# Patient Record
Sex: Female | Born: 1985 | Race: Black or African American | Hispanic: No | Marital: Single | Smoking: Current every day smoker
Health system: Southern US, Community
[De-identification: ages and names within clinical notes are randomized; demographics above are authoritative.]

## PROBLEM LIST (undated history)

## (undated) DIAGNOSIS — F1020 Alcohol dependence, uncomplicated: Secondary | ICD-10-CM

## (undated) DIAGNOSIS — R188 Other ascites: Secondary | ICD-10-CM

## (undated) DIAGNOSIS — K7460 Unspecified cirrhosis of liver: Secondary | ICD-10-CM

## (undated) DIAGNOSIS — K219 Gastro-esophageal reflux disease without esophagitis: Secondary | ICD-10-CM

---

## 2013-05-09 ENCOUNTER — Emergency Department: Payer: Self-pay | Admitting: Emergency Medicine

## 2013-05-10 LAB — BASIC METABOLIC PANEL
Anion Gap: 3 — ABNORMAL LOW (ref 7–16)
BUN: 4 mg/dL — ABNORMAL LOW (ref 7–18)
CREATININE: 0.67 mg/dL (ref 0.60–1.30)
Calcium, Total: 8.5 mg/dL (ref 8.5–10.1)
Chloride: 111 mmol/L — ABNORMAL HIGH (ref 98–107)
Co2: 29 mmol/L (ref 21–32)
EGFR (Non-African Amer.): >60
GLUCOSE: 85 mg/dL (ref 65–99)
Osmolality: 281 (ref 275–301)
Potassium: 3.5 mmol/L (ref 3.5–5.1)
Sodium: 143 mmol/L (ref 136–145)

## 2013-05-10 LAB — PRO B NATRIURETIC PEPTIDE: B-Type Natriuretic Peptide: 25 pg/mL (ref 0–125)

## 2013-05-10 LAB — CBC
HCT: 38.3 % (ref 35.0–47.0)
HGB: 13.3 g/dL (ref 12.0–16.0)
MCH: 34.1 pg — ABNORMAL HIGH (ref 26.0–34.0)
MCHC: 34.7 g/dL (ref 32.0–36.0)
MCV: 98 fL (ref 80–100)
Platelet: 148 10*3/uL — ABNORMAL LOW (ref 150–440)
RBC: 3.9 10*6/uL (ref 3.80–5.20)
RDW: 13.8 % (ref 11.5–14.5)
WBC: 4.7 10*3/uL (ref 3.6–11.0)

## 2014-05-14 ENCOUNTER — Emergency Department: Admit: 2014-05-14 | Disposition: A | Discharge status: Home / Self Care | Payer: Self-pay | Admitting: Student

## 2014-05-14 LAB — CBC WITH DIFFERENTIAL/PLATELET
BASOS ABS: 0.1 10*3/uL (ref 0.0–0.1)
Basophil %: 1 %
Eosinophil #: 0.1 10*3/uL (ref 0.0–0.7)
Eosinophil %: 0.7 %
HCT: 36.5 % (ref 35.0–47.0)
HGB: 12.5 g/dL (ref 12.0–16.0)
Lymphocyte #: 2.5 10*3/uL (ref 1.0–3.6)
Lymphocyte %: 21.3 %
MCH: 35.2 pg — AB (ref 26.0–34.0)
MCHC: 34.2 g/dL (ref 32.0–36.0)
MCV: 103 fL — AB (ref 80–100)
MONO ABS: 1.1 x10 3/mm — AB (ref 0.2–0.9)
Monocyte %: 9.2 %
NEUTROS ABS: 7.9 10*3/uL — AB (ref 1.4–6.5)
Neutrophil %: 67.8 %
PLATELETS: 219 10*3/uL (ref 150–440)
RBC: 3.55 10*6/uL — ABNORMAL LOW (ref 3.80–5.20)
RDW: 14.4 % (ref 11.5–14.5)
WBC: 11.7 10*3/uL — ABNORMAL HIGH (ref 3.6–11.0)

## 2014-05-14 LAB — URINALYSIS, COMPLETE
BLOOD: NEGATIVE
Bilirubin,UR: NEGATIVE
GLUCOSE, UR: NEGATIVE mg/dL (ref 0–75)
KETONE: NEGATIVE
Leukocyte Esterase: NEGATIVE
NITRITE: NEGATIVE
PROTEIN: NEGATIVE
Ph: 7 (ref 4.5–8.0)
SPECIFIC GRAVITY: 1.005 (ref 1.003–1.030)

## 2014-05-14 LAB — COMPREHENSIVE METABOLIC PANEL
ANION GAP: 10 (ref 7–16)
AST: 374 U/L — AB
Albumin: 3.2 g/dL — ABNORMAL LOW
Alkaline Phosphatase: 218 U/L — ABNORMAL HIGH
BUN: <5 mg/dL
Bilirubin,Total: 2.3 mg/dL — ABNORMAL HIGH
CO2: 28 mmol/L
CREATININE: 0.53 mg/dL
Calcium, Total: 9 mg/dL
Chloride: 99 mmol/L — ABNORMAL LOW
EGFR (African American): >60
Glucose: 131 mg/dL — ABNORMAL HIGH
POTASSIUM: 2.7 mmol/L — AB
SGPT (ALT): 75 U/L — ABNORMAL HIGH
Sodium: 137 mmol/L
Total Protein: 7.1 g/dL

## 2014-05-14 LAB — LIPASE, BLOOD: LIPASE: 30 U/L

## 2014-05-14 LAB — PROTIME-INR
INR: 1.2
Prothrombin Time: 14.9 secs

## 2014-05-14 LAB — APTT: ACTIVATED PTT: 35 s (ref 23.6–35.9)

## 2014-06-06 ENCOUNTER — Other Ambulatory Visit: Payer: Self-pay | Admitting: Nurse Practitioner

## 2014-06-06 DIAGNOSIS — R945 Abnormal results of liver function studies: Secondary | ICD-10-CM

## 2014-06-06 DIAGNOSIS — R7989 Other specified abnormal findings of blood chemistry: Secondary | ICD-10-CM

## 2014-06-06 DIAGNOSIS — R188 Other ascites: Secondary | ICD-10-CM

## 2014-06-07 ENCOUNTER — Other Ambulatory Visit: Payer: Self-pay | Admitting: Radiology

## 2014-06-08 ENCOUNTER — Ambulatory Visit
Admission: RE | Admit: 2014-06-08 | Discharge: 2014-06-08 | Disposition: A | Discharge status: Home / Self Care | Payer: Medicaid Other | Source: Ambulatory Visit | Attending: Nurse Practitioner | Admitting: Nurse Practitioner

## 2014-06-08 ENCOUNTER — Other Ambulatory Visit: Payer: Self-pay | Admitting: Nurse Practitioner

## 2014-06-08 DIAGNOSIS — R188 Other ascites: Secondary | ICD-10-CM | POA: Diagnosis present

## 2014-06-08 DIAGNOSIS — R945 Abnormal results of liver function studies: Secondary | ICD-10-CM | POA: Diagnosis present

## 2014-06-08 DIAGNOSIS — R7989 Other specified abnormal findings of blood chemistry: Secondary | ICD-10-CM

## 2014-06-28 ENCOUNTER — Ambulatory Visit: Payer: Medicaid Other | Admitting: Certified Registered Nurse Anesthetist

## 2014-06-28 ENCOUNTER — Encounter
Admission: RE | Disposition: A | Discharge status: Home / Self Care | Payer: Self-pay | Source: Ambulatory Visit | Attending: Gastroenterology

## 2014-06-28 ENCOUNTER — Ambulatory Visit
Admission: RE | Admit: 2014-06-28 | Discharge: 2014-06-28 | Disposition: A | Discharge status: Home / Self Care | Payer: Medicaid Other | Source: Ambulatory Visit | Attending: Gastroenterology | Admitting: Gastroenterology

## 2014-06-28 DIAGNOSIS — K7460 Unspecified cirrhosis of liver: Secondary | ICD-10-CM | POA: Diagnosis present

## 2014-06-28 DIAGNOSIS — K7031 Alcoholic cirrhosis of liver with ascites: Secondary | ICD-10-CM | POA: Diagnosis not present

## 2014-06-28 DIAGNOSIS — K766 Portal hypertension: Secondary | ICD-10-CM | POA: Diagnosis not present

## 2014-06-28 DIAGNOSIS — F329 Major depressive disorder, single episode, unspecified: Secondary | ICD-10-CM | POA: Insufficient documentation

## 2014-06-28 DIAGNOSIS — K5900 Constipation, unspecified: Secondary | ICD-10-CM | POA: Insufficient documentation

## 2014-06-28 DIAGNOSIS — K2950 Unspecified chronic gastritis without bleeding: Secondary | ICD-10-CM | POA: Diagnosis not present

## 2014-06-28 DIAGNOSIS — Z79899 Other long term (current) drug therapy: Secondary | ICD-10-CM | POA: Diagnosis not present

## 2014-06-28 DIAGNOSIS — K3189 Other diseases of stomach and duodenum: Secondary | ICD-10-CM | POA: Insufficient documentation

## 2014-06-28 DIAGNOSIS — Z87891 Personal history of nicotine dependence: Secondary | ICD-10-CM | POA: Diagnosis not present

## 2014-06-28 DIAGNOSIS — K219 Gastro-esophageal reflux disease without esophagitis: Secondary | ICD-10-CM | POA: Diagnosis not present

## 2014-06-28 DIAGNOSIS — K760 Fatty (change of) liver, not elsewhere classified: Secondary | ICD-10-CM | POA: Insufficient documentation

## 2014-06-28 DIAGNOSIS — K7010 Alcoholic hepatitis without ascites: Secondary | ICD-10-CM | POA: Diagnosis not present

## 2014-06-28 HISTORY — DX: Gastro-esophageal reflux disease without esophagitis: K21.9

## 2014-06-28 HISTORY — PX: ESOPHAGOGASTRODUODENOSCOPY: SHX5428

## 2014-06-28 LAB — POCT PREGNANCY, URINE: Preg Test, Ur: NEGATIVE

## 2014-06-28 SURGERY — EGD (ESOPHAGOGASTRODUODENOSCOPY)
Anesthesia: General

## 2014-06-28 MED ORDER — PROPOFOL 10 MG/ML IV BOLUS
INTRAVENOUS | Status: DC | PRN
  Administered 2014-06-28: 50 mg via INTRAVENOUS

## 2014-06-28 MED ORDER — SODIUM CHLORIDE 0.9 % IV SOLN
INTRAVENOUS | Status: DC
  Administered 2014-06-28: 1000 mL via INTRAVENOUS

## 2014-06-28 MED ORDER — FENTANYL CITRATE (PF) 100 MCG/2ML IJ SOLN
INTRAMUSCULAR | Status: DC | PRN
  Administered 2014-06-28: 50 ug via INTRAVENOUS

## 2014-06-28 MED ORDER — MIDAZOLAM HCL 2 MG/2ML IJ SOLN
INTRAMUSCULAR | Status: DC | PRN
  Administered 2014-06-28: 1 mg via INTRAVENOUS

## 2014-06-28 MED ORDER — GLYCOPYRROLATE 0.2 MG/ML IJ SOLN
INTRAMUSCULAR | Status: DC | PRN
  Administered 2014-06-28: 0.2 mg via INTRAVENOUS

## 2014-06-28 MED ORDER — SODIUM CHLORIDE 0.9 % IV SOLN: INTRAVENOUS | Status: DC

## 2014-06-28 MED ORDER — PROPOFOL INFUSION 10 MG/ML OPTIME
INTRAVENOUS | Status: DC | PRN
  Administered 2014-06-28: 100 ug/kg/min via INTRAVENOUS

## 2014-06-28 MED ORDER — LIDOCAINE HCL (CARDIAC) 20 MG/ML IV SOLN
INTRAVENOUS | Status: DC | PRN
  Administered 2014-06-28: 100 mg via INTRAVENOUS

## 2014-06-28 NOTE — Anesthesia Postprocedure Evaluation (Signed)
  Anesthesia Post-op Note  Patient: Janet Weiss  Procedure(s) Performed: Procedure(s): ESOPHAGOGASTRODUODENOSCOPY (EGD) (N/A)  Anesthesia type:General  Patient location: PACU  Post pain: Pain level controlled  Post assessment: Post-op Vital signs reviewed, Patient's Cardiovascular Status Stable, Respiratory Function Stable, Patent Airway and No signs of Nausea or vomiting  Post vital signs: Reviewed and stable  Last Vitals:  Filed Vitals:   06/28/14 1210  BP: 111/73  Pulse: 76  Temp:   Resp: 20    Level of consciousness: awake, alert  and patient cooperative  Complications: No apparent anesthesia complications

## 2014-06-28 NOTE — Transfer of Care (Signed)
Immediate Anesthesia Transfer of Care Note  Patient: Janet Weiss  Procedure(s) Performed: Procedure(s): ESOPHAGOGASTRODUODENOSCOPY (EGD) (N/A)  Patient Location: Endoscopy Unit  Anesthesia Type:General  Level of Consciousness: sedated  Airway & Oxygen Therapy: Patient Spontanous Breathing and Patient connected to nasal cannula oxygen  Post-op Assessment: Report given to RN and Post -op Vital signs reviewed and stable  Post vital signs: Reviewed and stable  Last Vitals:  Filed Vitals:   06/28/14 1143  BP: 102/65  Pulse: 81  Temp: 36 C  Resp: 20    Complications: No apparent anesthesia complications

## 2014-06-28 NOTE — Op Note (Signed)
[facility] Gastroenterology Patient Name: Janet Weiss Procedure Date: 06/28/2014 11:29 AM MRN: [id number] Account #: [id number] Date of Birth: 1985-02-19 Admit Type: Outpatient Age: 29 Room: [facility] ENDO ROOM 4 Gender: Female Note Status: Finalized Procedure:         Upper GI endoscopy Indications:       Suspected esophageal reflux, newly diagnosed cirrhosis Providers:         Ezzard Standing. Bluford Kaufmann, MD Referring MD:      Sallye Lat Md, MD (Referring MD) Medicines:         Monitored Anesthesia Care Complications:     No immediate complications. Procedure:         Pre-Anesthesia Assessment:                    - Prior to the procedure, a History and Physical was                     performed, and patient medications, allergies and                     sensitivities were reviewed. The patient's tolerance of                     previous anesthesia was reviewed.                    - The risks and benefits of the procedure and the sedation                     options and risks were discussed with the patient. All                     questions were answered and informed consent was obtained.                    - After reviewing the risks and benefits, the patient was                     deemed in satisfactory condition to undergo the procedure.                    After obtaining informed consent, the endoscope was passed                     under direct vision. Throughout the procedure, the                     patient's blood pressure, pulse, and oxygen saturations                     were monitored continuously. The Endoscope was introduced                     through the mouth, and advanced to the second part of                     duodenum. The upper GI endoscopy was accomplished without                     difficulty. The patient tolerated the procedure well. Findings:      The examined esophagus was normal. Biopsies were taken with a cold       forceps for histology.  Mild portal hypertensive gastropathy was found in the gastric fundus.  Biopsies were taken with a cold forceps for histology.      The exam was otherwise without abnormality.      The examined duodenum was normal. Impression:        - Normal esophagus. Biopsied.                    - Portal hypertensive gastropathy. Biopsied.                    - The examination was otherwise normal.                    - Normal examined duodenum. Recommendation:    - Discharge patient to home.                    - Observe patient's clinical course.                    - Continue present medications.                    - The findings and recommendations were discussed with the                     patient. Procedure Code(s): --- Professional ---                    [phone], Esophagogastroduodenoscopy, flexible, transoral;                     with biopsy, single or multiple Diagnosis Code(s): --- Professional ---                    K76.6, Portal hypertension                    K31.89, Other diseases of stomach and duodenum CPT copyright 2014 American Medical Association. All rights reserved. The codes documented in this report are preliminary and upon coder review may  be revised to meet current compliance requirements. Wallace CullensPaul Y Amarra Sawyer, MD 06/28/2014 11:40:11 AM This report has been signed electronically. Number of Addenda: 0 Note Initiated On: 06/28/2014 11:29 AM      [facility]

## 2014-06-28 NOTE — Anesthesia Preprocedure Evaluation (Signed)
Anesthesia Evaluation  Patient identified by MRN, date of birth, ID band Patient awake    Reviewed: Allergy & Precautions, NPO status , Patient's Chart, lab work & pertinent test results  History of Anesthesia Complications Negative for: history of anesthetic complications  Airway Mallampati: II  TM Distance: >3 FB Neck ROM: Full    Dental no notable dental hx.    Pulmonary neg pulmonary ROS, former smoker,  breath sounds clear to auscultation  Pulmonary exam normal       Cardiovascular Exercise Tolerance: Good Normal cardiovascular examRhythm:Regular Rate:Normal     Neuro/Psych PSYCHIATRIC DISORDERS Depression negative neurological ROS     GI/Hepatic GERD-  Medicated and Poorly Controlled,(+) Cirrhosis -  ascites    ,   Endo/Other  negative endocrine ROS  Renal/GU negative Renal ROS  negative genitourinary   Musculoskeletal negative musculoskeletal ROS (+)   Abdominal   Peds negative pediatric ROS (+)  Hematology negative hematology ROS (+)   Anesthesia Other Findings   Reproductive/Obstetrics negative OB ROS                             Anesthesia Physical Anesthesia Plan  ASA: III  Anesthesia Plan: General   Post-op Pain Management:    Induction: Intravenous  Airway Management Planned: Nasal Cannula  Additional Equipment:   Intra-op Plan:   Post-operative Plan:   Informed Consent: I have reviewed the patients History and Physical, chart, labs and discussed the procedure including the risks, benefits and alternatives for the proposed anesthesia with the patient or authorized representative who has indicated his/her understanding and acceptance.     Plan Discussed with: CRNA and Surgeon  Anesthesia Plan Comments:         Anesthesia Quick Evaluation

## 2014-06-28 NOTE — H&P (Signed)
  Date of Initial H&P: 06/20/2014  History reviewed, patient examined, no change in status, stable for surgery. 

## 2014-06-29 ENCOUNTER — Encounter: Payer: Self-pay | Admitting: Gastroenterology

## 2014-06-29 LAB — SURGICAL PATHOLOGY

## 2014-07-03 ENCOUNTER — Other Ambulatory Visit
Admission: RE | Admit: 2014-07-03 | Discharge: 2014-07-03 | Disposition: A | Discharge status: Home / Self Care | Payer: Medicaid Other | Attending: Gastroenterology | Admitting: Gastroenterology

## 2014-07-03 DIAGNOSIS — K760 Fatty (change of) liver, not elsewhere classified: Secondary | ICD-10-CM | POA: Diagnosis not present

## 2014-07-03 LAB — AMMONIA: AMMONIA: 28 umol/L (ref 9–35)

## 2015-01-11 ENCOUNTER — Other Ambulatory Visit: Payer: Self-pay | Admitting: Nurse Practitioner

## 2015-01-11 DIAGNOSIS — K7031 Alcoholic cirrhosis of liver with ascites: Secondary | ICD-10-CM

## 2015-01-18 ENCOUNTER — Ambulatory Visit: Payer: Medicaid Other

## 2015-02-05 ENCOUNTER — Ambulatory Visit
Admission: RE | Admit: 2015-02-05 | Discharge: 2015-02-05 | Disposition: A | Discharge status: Home / Self Care | Payer: Medicaid Other | Source: Ambulatory Visit | Attending: Nurse Practitioner | Admitting: Nurse Practitioner

## 2015-02-05 DIAGNOSIS — R160 Hepatomegaly, not elsewhere classified: Secondary | ICD-10-CM | POA: Diagnosis not present

## 2015-02-05 DIAGNOSIS — K7031 Alcoholic cirrhosis of liver with ascites: Secondary | ICD-10-CM | POA: Diagnosis present

## 2015-04-17 ENCOUNTER — Emergency Department
Admission: EM | Admit: 2015-04-17 | Discharge: 2015-04-17 | Disposition: A | Discharge status: Home / Self Care | Payer: Medicaid Other | Attending: Student | Admitting: Student

## 2015-04-17 ENCOUNTER — Encounter: Payer: Self-pay | Admitting: Emergency Medicine

## 2015-04-17 DIAGNOSIS — K644 Residual hemorrhoidal skin tags: Secondary | ICD-10-CM | POA: Diagnosis not present

## 2015-04-17 DIAGNOSIS — Z79899 Other long term (current) drug therapy: Secondary | ICD-10-CM | POA: Insufficient documentation

## 2015-04-17 DIAGNOSIS — Z87891 Personal history of nicotine dependence: Secondary | ICD-10-CM | POA: Insufficient documentation

## 2015-04-17 DIAGNOSIS — K6289 Other specified diseases of anus and rectum: Secondary | ICD-10-CM | POA: Diagnosis present

## 2015-04-17 DIAGNOSIS — K602 Anal fissure, unspecified: Secondary | ICD-10-CM | POA: Insufficient documentation

## 2015-04-17 MED ORDER — DIBUCAINE 1 % RE OINT: 1.0000 "application " | TOPICAL_OINTMENT | Freq: Three times a day (TID) | RECTAL | Status: AC | PRN

## 2015-04-17 MED ORDER — LIDOCAINE HCL 2 % EX GEL
CUTANEOUS | Status: AC
  Administered 2015-04-17: 1
  Filled 2015-04-17: qty 10

## 2015-04-17 MED ORDER — LIDOCAINE HCL 2 % EX GEL
1.0000 "application " | Freq: Once | CUTANEOUS | Status: AC
  Administered 2015-04-17: 1

## 2015-04-17 NOTE — ED Provider Notes (Signed)
[facility] Emergency Department Provider Note ____________________________________________  Time seen: 2257  I have reviewed the triage vital signs and the nursing notes.  HISTORY  Chief Complaint  Rectal Pain  HPI Janet Weiss is a 30 y.o. female presents to the EDfor evaluation of acute rectal pain that she has noted over the last 2 days. She does report that she typically has soft stools and often has multiple stools per day. The medication that she takes for her liver also makes her have softer stools. She reports that the her frequency of stools has increased sharply over the last few days. She denies any blood in stools, acute abdominal pain, or weight loss. She has used topical diaper rash cream without much benefit.  Past Medical History  Diagnosis Date  . GERD (gastroesophageal reflux disease)     There are no active problems to display for this patient.   Past Surgical History  Procedure Laterality Date  . Esophagogastroduodenoscopy N/A 06/28/2014    Procedure: ESOPHAGOGASTRODUODENOSCOPY (EGD);  Surgeon: Wallace CullensPaul Y Oh, MD;  [facility];  Service: Gastroenterology;  Laterality: N/A;    Current Outpatient Rx  Name  Route  Sig  Dispense  Refill  . busPIRone (BUSPAR) 7.5 MG tablet   Oral   Take 7.5 mg by mouth 2 (two) times daily as needed (anxiety).          Marland Kitchen. dexlansoprazole (DEXILANT) 60 MG capsule   Oral   Take 60 mg by mouth daily.         . dibucaine (NUPERCAINAL) 1 % OINT   Rectal   Place 1 application rectally 3 (three) times daily as needed for pain.   30 g   0   . furosemide (LASIX) 40 MG tablet   Oral   Take 40 mg by mouth.         Boris Lown. Krill Oil 300 MG CAPS   Oral   Take 350 mg by mouth daily.          . Prenatal Multivit-Min-Fe-FA (PRE-NATAL PO)   Oral   Take 1 tablet by mouth daily.         . sertraline (ZOLOFT) 25 MG tablet   Oral   Take 25 mg by mouth 2 (two) times daily.          Marland Kitchen.  spironolactone (ALDACTONE) 50 MG tablet   Oral   Take 50 mg by mouth daily.         Marland Kitchen. sulfamethoxazole-trimethoprim (BACTRIM DS,SEPTRA DS) 800-160 MG per tablet   Oral   Take 1 tablet by mouth 2 (two) times daily as needed (cyists). X 3 days         . valACYclovir (VALTREX) 1000 MG tablet   Oral   Take 1,000 mg by mouth daily.           Allergies Review of patient's allergies indicates no known allergies.  No family history on file.  Social History Social History  Substance Use Topics  . Smoking status: Former Smoker -- 0.50 packs/day  . Smokeless tobacco: None  . Alcohol Use: Yes     Comment: twice/weekly   Review of Systems  Constitutional: Negative for fever. Cardiovascular: Negative for chest pain. Respiratory: Negative for shortness of breath. Gastrointestinal: Negative for abdominal pain, vomiting and diarrhea. Reports baseline soft stools and Rectal pain as above. Genitourinary: Negative for dysuria.   Musculoskeletal: Negative for back pain. Skin: Negative for rash. Neurological: Negative for headaches, focal weakness or numbness.  ____________________________________________  PHYSICAL EXAM:  VITAL SIGNS: ED Triage Vitals  Enc Vitals Group     BP 04/17/15 2157 127/51 mmHg     Pulse Rate 04/17/15 2157 102     Resp 04/17/15 2157 18     Temp 04/17/15 2157 97.9 F (36.6 C)     Temp src --      SpO2 04/17/15 2157 99 %     Weight 04/17/15 2157 152 lb (68.947 kg)     Height 04/17/15 2157  (1.575 m)     Head Cir --      Peak Flow --      Pain Score 04/17/15 2157 10     Pain Loc --      Pain Edu? --      Excl. in GC? --    Constitutional: Alert and oriented. Well appearing and in no distress. Head: Normocephalic and atraumatic. Hematological/Lymphatic/Immunological: No cervical lymphadenopathy. Cardiovascular: Normal rate, regular rhythm.  Respiratory: Normal respiratory effort.  Gastrointestinal: Soft and nontender. No distention. Patient with  a small rectal fissure and small, non-thrombosed external hemorrhoid noted Musculoskeletal: Nontender with normal range of motion in all extremities.  Neurologic:  Normal gait without ataxia. Normal speech and language. No gross focal neurologic deficits are appreciated. Skin:  Skin is warm, dry and intact. No rash noted. Psychiatric: Mood and affect are normal. Patient exhibits appropriate insight and judgment. ____________________________________________  PROCEDURES  Urojet Lidocaine 2% jelly applied ____________________________________________  INITIAL IMPRESSION / ASSESSMENT AND PLAN / ED COURSE  Patient with acute rectal pain secondary to anal fissure and small, nonthrombosed, external hemorrhoid. She'll be discharged with a prescription for dibucaine for topical pain relief. She will also be advised to apply barrier creams to the rectal area stated. She'll follow-up with her primary care provider as needed. ____________________________________________  FINAL CLINICAL IMPRESSION(S) / ED DIAGNOSES  Final diagnoses:  Rectal fissure  Rectal pain      Lissa Hoard, PA-C 04/18/15 0003  Gayla Doss, MD 04/18/15 0009

## 2015-04-17 NOTE — ED Notes (Addendum)
Patient ambulatory to triage with steady gait, without difficulty or distress noted; pt reports taking medication for her liver "that makes me poop" and having rectal pain; denies any abd pain; denies any blood noted in stools

## 2015-04-17 NOTE — ED Notes (Signed)
Pt informed to undress from waste down for provider evaluation

## 2015-04-17 NOTE — Discharge Instructions (Signed)
Anal Fissure, Adult An anal fissure is a small tear or crack in the skin around the anus. Bleeding from a fissure usually stops on its own within a few minutes. However, bleeding will often occur again with each bowel movement until the crack heals. CAUSES This condition may be caused by:  Passing large, hard stool (feces).  Frequent diarrhea.  Constipation.  Inflammatory bowel disease (Crohn disease or ulcerative colitis).  Infections.  Anal sex. SYMPTOMS Symptoms of this condition include:  Bleeding from the rectum.  Small amounts of blood seen on your stool, on toilet paper, or in the toilet after a bowel movement.  Painful bowel movements.  Itching or irritation around the anus. DIAGNOSIS A health care provider may diagnose this condition by closely examining the anal area. An anal fissure can usually be seen with careful inspection. In some cases, a rectal exam may be performed, or a short tube (anoscope) may be used to examine the anal canal. TREATMENT Treatment for this condition may include:  Taking steps to avoid constipation. This may include making changes to your diet, such as increasing your intake of fiber or fluid.  Taking fiber supplements. These supplements can soften your stool to help make bowel movements easier. Your health care provider may also prescribe a stool softener if your stool is often hard.  Taking sitz baths. This may help to heal the tear.  Using medicated creams or ointments. These may be prescribed to lessen discomfort. HOME CARE INSTRUCTIONS Eating and Drinking  Avoid foods that may be constipating, such as bananas and dairy products.  Drink enough fluid to keep your urine clear or pale yellow.  Maintain a diet that is high in fruits, whole grains, and vegetables. General Instructions  Keep the anal area as clean and dry as possible.  Take sitz baths as told by your health care provider. Do not use soap in the sitz baths.  Take  over-the-counter and prescription medicines only as told by your health care provider.  Use creams or ointments only as told by your health care provider.  Keep all follow-up visits as told by your health care provider. This is important. SEEK MEDICAL CARE IF:  You have more bleeding.  You have a fever.  You have diarrhea that is mixed with blood.  You continue to have pain.  Your problem is getting worse rather than better.   This information is not intended to replace advice given to you by your health care provider. Make sure you discuss any questions you have with your health care provider.   Document Released: 01/12/2005 Document Revised: 10/03/2014 Document Reviewed: 04/09/2014 Elsevier Interactive Patient Education 2016 [employer]   Use the topical gel as directed. Consider using disposable toilet wipes and diaper barrier cream.

## 2015-04-22 ENCOUNTER — Other Ambulatory Visit
Admission: RE | Admit: 2015-04-22 | Discharge: 2015-04-22 | Disposition: A | Discharge status: Home / Self Care | Payer: Medicaid Other | Source: Ambulatory Visit | Attending: Nurse Practitioner | Admitting: Nurse Practitioner

## 2015-04-22 DIAGNOSIS — K7031 Alcoholic cirrhosis of liver with ascites: Secondary | ICD-10-CM | POA: Insufficient documentation

## 2015-04-22 LAB — AMMONIA: Ammonia: 56 umol/L — ABNORMAL HIGH (ref 9–35)

## 2015-06-10 ENCOUNTER — Other Ambulatory Visit: Payer: Self-pay | Admitting: Physician Assistant

## 2015-06-10 DIAGNOSIS — M545 Low back pain: Secondary | ICD-10-CM

## 2015-06-10 DIAGNOSIS — M5459 Other low back pain: Secondary | ICD-10-CM

## 2015-06-11 ENCOUNTER — Ambulatory Visit: Payer: Medicaid Other

## 2015-06-11 ENCOUNTER — Ambulatory Visit
Admission: RE | Admit: 2015-06-11 | Discharge: 2015-06-11 | Disposition: A | Discharge status: Home / Self Care | Payer: Medicaid Other | Source: Ambulatory Visit | Attending: Physician Assistant | Admitting: Physician Assistant

## 2015-06-11 DIAGNOSIS — M5136 Other intervertebral disc degeneration, lumbar region: Secondary | ICD-10-CM | POA: Diagnosis not present

## 2015-06-11 DIAGNOSIS — M545 Low back pain: Secondary | ICD-10-CM | POA: Insufficient documentation

## 2015-06-11 DIAGNOSIS — M5459 Other low back pain: Secondary | ICD-10-CM

## 2015-08-05 ENCOUNTER — Other Ambulatory Visit: Payer: Self-pay | Admitting: Nurse Practitioner

## 2015-08-05 DIAGNOSIS — K7030 Alcoholic cirrhosis of liver without ascites: Secondary | ICD-10-CM

## 2015-08-09 ENCOUNTER — Ambulatory Visit
Admission: RE | Admit: 2015-08-09 | Discharge: 2015-08-09 | Disposition: A | Discharge status: Home / Self Care | Payer: Medicaid Other | Source: Ambulatory Visit | Attending: Nurse Practitioner | Admitting: Nurse Practitioner

## 2015-08-09 DIAGNOSIS — I81 Portal vein thrombosis: Secondary | ICD-10-CM | POA: Insufficient documentation

## 2015-08-09 DIAGNOSIS — I868 Varicose veins of other specified sites: Secondary | ICD-10-CM | POA: Diagnosis not present

## 2015-08-09 DIAGNOSIS — K7030 Alcoholic cirrhosis of liver without ascites: Secondary | ICD-10-CM | POA: Insufficient documentation

## 2015-08-09 DIAGNOSIS — R160 Hepatomegaly, not elsewhere classified: Secondary | ICD-10-CM | POA: Diagnosis not present

## 2015-08-23 ENCOUNTER — Emergency Department: Payer: Medicaid Other

## 2015-08-23 ENCOUNTER — Emergency Department
Admission: EM | Admit: 2015-08-23 | Discharge: 2015-08-23 | Discharge status: Against Medical Advice | Payer: Medicaid Other | Source: Home / Self Care | Attending: Emergency Medicine | Admitting: Emergency Medicine

## 2015-08-23 ENCOUNTER — Emergency Department
Admission: EM | Admit: 2015-08-23 | Discharge: 2015-08-24 | Disposition: A | Discharge status: Other Acute Inpatient Hospital | Payer: Medicaid Other | Attending: Emergency Medicine | Admitting: Emergency Medicine

## 2015-08-23 ENCOUNTER — Encounter: Payer: Self-pay | Admitting: Emergency Medicine

## 2015-08-23 DIAGNOSIS — K7031 Alcoholic cirrhosis of liver with ascites: Secondary | ICD-10-CM

## 2015-08-23 DIAGNOSIS — R509 Fever, unspecified: Secondary | ICD-10-CM

## 2015-08-23 DIAGNOSIS — Z87891 Personal history of nicotine dependence: Secondary | ICD-10-CM

## 2015-08-23 DIAGNOSIS — K652 Spontaneous bacterial peritonitis: Secondary | ICD-10-CM | POA: Insufficient documentation

## 2015-08-23 DIAGNOSIS — Z79899 Other long term (current) drug therapy: Secondary | ICD-10-CM | POA: Insufficient documentation

## 2015-08-23 DIAGNOSIS — Z8719 Personal history of other diseases of the digestive system: Secondary | ICD-10-CM

## 2015-08-23 DIAGNOSIS — K625 Hemorrhage of anus and rectum: Secondary | ICD-10-CM | POA: Diagnosis present

## 2015-08-23 DIAGNOSIS — R1011 Right upper quadrant pain: Secondary | ICD-10-CM

## 2015-08-23 DIAGNOSIS — R1084 Generalized abdominal pain: Secondary | ICD-10-CM

## 2015-08-23 DIAGNOSIS — K922 Gastrointestinal hemorrhage, unspecified: Secondary | ICD-10-CM

## 2015-08-23 HISTORY — DX: Unspecified cirrhosis of liver: K74.60

## 2015-08-23 LAB — COMPREHENSIVE METABOLIC PANEL
ALT: 48 U/L (ref 14–54)
AST: 125 U/L — ABNORMAL HIGH (ref 15–41)
Albumin: 3.3 g/dL — ABNORMAL LOW (ref 3.5–5.0)
Alkaline Phosphatase: 165 U/L — ABNORMAL HIGH (ref 38–126)
Anion gap: 11 (ref 5–15)
BUN: <5 mg/dL — ABNORMAL LOW (ref 6–20)
CHLORIDE: 103 mmol/L (ref 101–111)
CO2: 18 mmol/L — ABNORMAL LOW (ref 22–32)
CREATININE: 0.48 mg/dL (ref 0.44–1.00)
Calcium: 9 mg/dL (ref 8.9–10.3)
GFR calc Af Amer: >60 mL/min (ref >60)
Glucose, Bld: 165 mg/dL — ABNORMAL HIGH (ref 65–99)
POTASSIUM: 2.8 mmol/L — AB (ref 3.5–5.1)
Sodium: 132 mmol/L — ABNORMAL LOW (ref 135–145)
Total Bilirubin: 1.4 mg/dL — ABNORMAL HIGH (ref 0.3–1.2)
Total Protein: 7.3 g/dL (ref 6.5–8.1)

## 2015-08-23 LAB — PROTIME-INR
INR: 1.34
Prothrombin Time: 16.7 seconds — ABNORMAL HIGH (ref 11.4–15.2)

## 2015-08-23 LAB — URINALYSIS COMPLETE WITH MICROSCOPIC (ARMC ONLY)
Bacteria, UA: NONE SEEN
Bilirubin Urine: NEGATIVE
Glucose, UA: NEGATIVE mg/dL
Hgb urine dipstick: NEGATIVE
KETONES UR: NEGATIVE mg/dL
LEUKOCYTES UA: NEGATIVE
Nitrite: NEGATIVE
PH: 6 (ref 5.0–8.0)
Protein, ur: NEGATIVE mg/dL
RBC / HPF: NONE SEEN RBC/hpf (ref 0–5)
Specific Gravity, Urine: 1.002 — ABNORMAL LOW (ref 1.005–1.030)
WBC, UA: NONE SEEN WBC/hpf (ref 0–5)

## 2015-08-23 LAB — CBC WITH DIFFERENTIAL/PLATELET
Basophils Absolute: 0.2 10*3/uL — ABNORMAL HIGH (ref 0–0.1)
Basophils Relative: 1 %
EOS PCT: 1 %
Eosinophils Absolute: 0.1 10*3/uL (ref 0–0.7)
HCT: 39.2 % (ref 35.0–47.0)
Hemoglobin: 13.5 g/dL (ref 12.0–16.0)
Lymphocytes Relative: 25 %
Lymphs Abs: 3.3 10*3/uL (ref 1.0–3.6)
MCH: 35.5 pg — ABNORMAL HIGH (ref 26.0–34.0)
MCHC: 34.5 g/dL (ref 32.0–36.0)
MCV: 102.9 fL — AB (ref 80.0–100.0)
MONO ABS: 1.3 10*3/uL — AB (ref 0.2–0.9)
Monocytes Relative: 10 %
Neutro Abs: 8.2 10*3/uL — ABNORMAL HIGH (ref 1.4–6.5)
Neutrophils Relative %: 63 %
PLATELETS: 167 10*3/uL (ref 150–440)
RBC: 3.81 MIL/uL (ref 3.80–5.20)
RDW: 13.8 % (ref 11.5–14.5)
WBC: 13.2 10*3/uL — AB (ref 3.6–11.0)

## 2015-08-23 LAB — LIPASE, BLOOD: LIPASE: 26 U/L (ref 11–51)

## 2015-08-23 LAB — AMMONIA: AMMONIA: 47 umol/L — AB (ref 9–35)

## 2015-08-23 LAB — LACTIC ACID, PLASMA: LACTIC ACID, VENOUS: 2 mmol/L — AB (ref 0.5–1.9)

## 2015-08-23 MED ORDER — IBUPROFEN 600 MG PO TABS
600.0000 mg | ORAL_TABLET | Freq: Once | ORAL | Status: AC
  Administered 2015-08-23: 600 mg via ORAL
  Filled 2015-08-23: qty 1

## 2015-08-23 MED ORDER — DEXTROSE 5 % IV SOLN
2.0000 g | INTRAVENOUS | Status: DC
  Administered 2015-08-23: 2 g via INTRAVENOUS
  Filled 2015-08-23: qty 2

## 2015-08-23 NOTE — ED Provider Notes (Addendum)
[facility] Emergency Department Provider Note        Time seen: ----------------------------------------- 5:54 PM on 08/23/2015 -----------------------------------------    I have reviewed the triage vital signs and the nursing notes.   HISTORY  Chief Complaint Abdominal Pain    HPI Janet Weiss is a 30 y.o. female who presents to the ER for abdominal pain as well as blood in her stool. Patient was seen at the [facility] emergency Department today and was discharged. She reports an ultrasound  recently showed she had a blood clot in her liver. She has had some vomiting, reports passing persistent clots in her stool. She denies fevers or other complaints.   Past Medical History:  Diagnosis Date  . Cirrhosis (HCC)   . GERD (gastroesophageal reflux disease)     There are no active problems to display for this patient.   Past Surgical History:  Procedure Laterality Date  . ESOPHAGOGASTRODUODENOSCOPY N/A 06/28/2014   Procedure: ESOPHAGOGASTRODUODENOSCOPY (EGD);  Surgeon: Wallace Cullens, MD;  Location: [facility] ENDOSCOPY;  Service: Gastroenterology;  Laterality: N/A;    Allergies Review of patient's allergies indicates no known allergies.  Social History Social History  Substance Use Topics  . Smoking status: Former Smoker    Packs/day: 0.50  . Smokeless tobacco: Not on file  . Alcohol use Yes     Comment: twice/weekly    Review of Systems Constitutional: Negative for fever. Cardiovascular: Negative for chest pain. Respiratory: Negative for shortness of breath. Gastrointestinal: Positive for abdominal pain, rectal bleeding Genitourinary: Negative for dysuria. Musculoskeletal: Negative for back pain. Skin: Negative for rash. Neurological: Negative for headaches, focal weakness or numbness.  10-point ROS otherwise negative.  ____________________________________________   PHYSICAL EXAM:  VITAL SIGNS: ED Triage Vitals  Enc Vitals Group     BP  08/23/15 1546 (!) 123/59     Pulse Rate 08/23/15 1546 (!) 108     Resp 08/23/15 1546 18     Temp 08/23/15 1546 98.4 F (36.9 C)     Temp Source 08/23/15 1546 Oral     SpO2 --      Weight 08/23/15 1546 162 lb (73.5 kg)     Height 08/23/15 1546  (1.575 m)     Head Circumference --      Peak Flow --      Pain Score 08/23/15 1544 8     Pain Loc --      Pain Edu? --      Excl. in GC? --     Constitutional: Alert and oriented. Well appearing and in no distress. Eyes: Conjunctivae are normal. PERRL. Normal extraocular movements. ENT   Head: Normocephalic and atraumatic.   Nose: No congestion/rhinnorhea.   Mouth/Throat: Mucous membranes are moist.   Neck: No stridor. Cardiovascular: Normal rate, regular rhythm. No murmurs, rubs, or gallops. Respiratory: Normal respiratory effort without tachypnea nor retractions. Breath sounds are clear and equal bilaterally. No wheezes/rales/rhonchi. Gastrointestinal: Abdominal tenderness, hepatomegaly Rectal: No gross blood, no hemorrhoids Musculoskeletal: Nontender with normal range of motion in all extremities. No lower extremity tenderness nor edema. Neurologic:  Normal speech and language. No gross focal neurologic deficits are appreciated.  Skin:  Skin is warm, dry and intact. No rash noted. Psychiatric: Mood and affect are normal. Speech and behavior are normal.  ____________________________________________  ED COURSE:  Pertinent labs & imaging results that were available during my care of the patient were reviewed by me and considered in my medical decision making (see chart for details).  Patient presents to ER with multiple complaints including abdominal pain, headache and rectal bleeding. I will check basic labs and reevaluate. ____________________________________________    LABS (pertinent positives/negatives)  Labs Reviewed  CBC WITH DIFFERENTIAL/PLATELET - Abnormal; Notable for the following:       Result Value    WBC 13.2 (*)    MCV 102.9 (*)    MCH 35.5 (*)    Neutro Abs 8.2 (*)    Monocytes Absolute 1.3 (*)    Basophils Absolute 0.2 (*)    All other components within normal limits  COMPREHENSIVE METABOLIC PANEL - Abnormal; Notable for the following:    Sodium 132 (*)    Potassium 2.8 (*)    CO2 18 (*)    Glucose, Bld 165 (*)    BUN <5 (*)    Albumin 3.3 (*)    AST 125 (*)    Alkaline Phosphatase 165 (*)    Total Bilirubin 1.4 (*)    All other components within normal limits  AMMONIA - Abnormal; Notable for the following:    Ammonia 47 (*)    All other components within normal limits  URINALYSIS COMPLETEWITH MICROSCOPIC ([facility] ONLY) - Abnormal; Notable for the following:    Color, Urine YELLOW (*)    APPearance CLEAR (*)    Specific Gravity, Urine 1.002 (*)    Squamous Epithelial / LPF 0-5 (*)    All other components within normal limits  LIPASE, BLOOD  PROTIME-INR   ____________________________________________  FINAL ASSESSMENT AND PLAN  Abdominal pain, cirrhosis, rectal bleeding, leaving AGAINST MEDICAL ADVICE  Plan: Patient with labs and imaging as dictated above. The patient's second ER visit in last 24 hours. I'll recommend observation, serial blood count checks. Patient states she had a large bloody bowel movement while in the ER. She also needs to be evaluated for possible SBP. I will discuss with the hospitalist for admission.   Emily Filbert, MD We had planned to admit the patient but despite numerous attempts to keep her here she stated she was going out to smoke. We advised her if she left the ER for any reason she will be leaving AGAINST MEDICAL ADVICE. Patient subsequently left anyway.  Note: This dictation was prepared with Dragon dictation. Any transcriptional errors that result from this process are unintentional    Emily Filbert, MD 08/23/15 1831    Emily Filbert, MD 08/23/15 2004

## 2015-08-23 NOTE — ED Triage Notes (Signed)
Pt ambulatory to triage with no difficulty. Pt reports she has been having rectal bleeding that started yesterday. Pt states she is bleeding a lot when asked how much. Pt states she feels like she has a fever. Pt states she was here earlier and told she was going to be admitted but got upset and left. Pt does report lower abd cramping.

## 2015-08-23 NOTE — ED Notes (Signed)
Called from lobby, no response

## 2015-08-23 NOTE — ED Notes (Signed)
Pt left room. Marcelino Duster, RN in triage notified that pt had left room. Marcelino Duster, RN witnessed pt outside walking to a vehicle. Marcelino Duster, RN explained to pt that she could not be outside with an IV in her arm. Marcelino Duster, RN removed IV. Pt cooperative and continued to sit outside.

## 2015-08-23 NOTE — ED Notes (Signed)
Pt left AMA earlier from CDU seen by Dr. Mayford Knife, was awaiting admission. Per Dr. Mayford Knife if patient is to return to ED we are to repeat abd pain protocols and she is to be reevaluated by ED.

## 2015-08-23 NOTE — ED Notes (Signed)
Pt left AMA. Pt refused to sign AMA form. Pt refused discharge paperwork. Pt refused vital signs. Risks of leaving AMA explained to pt.

## 2015-08-23 NOTE — ED Notes (Signed)
Pt refusing to remain NPO at this time, friend in room with food. This nurse explained reason for staying NPO but pt continues to refuse.  Dr. Dolores Frame informed

## 2015-08-23 NOTE — ED Notes (Signed)
Pt stated that she was going to go outside and smoke. This nurse explained to pt that the hospital campus is smoke free and she could not go outside to smoke. Pt stated she needed to have one cigarette before she was admitted. This nurse explained again to pt that she could not go outside to smoke.

## 2015-08-23 NOTE — ED Provider Notes (Signed)
[facility] Emergency Department Provider Note   ____________________________________________   First MD Initiated Contact with Patient 08/23/15 2315     (approximate)  I have reviewed the triage vital signs and the nursing notes.   HISTORY  Chief Complaint Rectal Bleeding    HPI Janet Weiss is a 30 y.o. female who returns to the ED after leaving AMA from a visit last evening. 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 She was seen at the [facility] emergency Department yesterday for abdominal pain, fever and rectal bleeding. Felt to have hemorrhoidal bleeding and discharged home. She spoke with her GI doctor's office (Dr. Bluford Kaufmann) who is supposed to be scheduling an appointment for her.She came to our facility last evening for same; received 1 dose of IV Rocephin for possible SBP and was offered admission for evaluation of SBP as well as serial blood count checks for rectal bleeding. However, she left AMA because she was upset that she was not allowed to go outside to smoke. She now returns for admission. Denies chest pain, shortness of breath, nausea, vomiting, diarrhea. States she has been having to strain to have hard bowel movements. Describes bright red blood per rectum. Denies black, tarry or melanotic stools.   Past Medical History:  Diagnosis Date  . Cirrhosis (HCC)   . GERD (gastroesophageal reflux disease)     There are no active problems to display for this patient.   Past Surgical History:  Procedure Laterality Date  . ESOPHAGOGASTRODUODENOSCOPY N/A 06/28/2014   Procedure: ESOPHAGOGASTRODUODENOSCOPY (EGD);  Surgeon: Wallace Cullens, MD;  Location: [facility] ENDOSCOPY;  Service: Gastroenterology;  Laterality: N/A;    Prior to Admission medications   Medication Sig Start Date End Date Taking? Authorizing Provider  lactulose (CHRONULAC) 10 GM/15ML solution Take 30 mLs by mouth 3 (three) times daily.  08/05/15  Yes Historical Provider, MD  nadolol (CORGARD) 20 MG tablet Take 20 mg by mouth daily. 08/05/15 09/04/15 Yes Historical Provider, MD  pantoprazole (PROTONIX) 40 MG tablet Take 40 mg by mouth 2 (two) times daily. 08/05/15  Yes Historical Provider, MD  potassium chloride (K-DUR) 10 MEQ tablet Take 10 mEq by mouth daily. 08/05/15 09/04/15 Yes Historical Provider, MD  ranitidine (ZANTAC) 300 MG tablet Take 300 mg by mouth at bedtime. 08/05/15 08/04/16 Yes Historical Provider, MD  rifaximin (XIFAXAN) 550 MG TABS tablet Take 550 mg by mouth 2 (two) times daily. 08/05/15 09/04/15 Yes Historical Provider, MD  traMADol (ULTRAM) 50 MG tablet Take 50 mg by mouth every 6 (six) hours as needed. 08/23/15  Yes Historical Provider, MD  busPIRone (BUSPAR) 7.5 MG tablet Take 7.5 mg by mouth 2 (two) times daily as needed (anxiety).     Historical Provider, MD  dexlansoprazole (DEXILANT) 60 MG capsule Take 60 mg by mouth daily.    Historical Provider, MD  dibucaine (NUPERCAINAL) 1 % OINT Place 1 application rectally 3 (three) times daily as needed for pain. 04/17/15   Jenise V Bacon Menshew, PA-C  furosemide (LASIX) 40 MG tablet Take 40 mg by mouth daily.     Historical Provider, MD  Boris Lown Oil 300 MG CAPS Take 350 mg by mouth daily.     Historical Provider, MD  Prenatal Multivit-Min-Fe-FA (PRE-NATAL PO) Take 1 tablet by mouth daily.    Historical Provider, MD  sertraline (ZOLOFT) 25 MG tablet Take 25 mg by mouth 2 (two) times daily.     Historical Provider, MD  spironolactone (ALDACTONE) 50 MG tablet Take 50 mg by mouth daily.    Historical Provider, MD  sulfamethoxazole-trimethoprim (BACTRIM DS,SEPTRA DS) 800-160 MG per tablet Take 1 tablet by mouth 2 (two) times daily as needed (cyists). X 3 days    Historical Provider, MD  valACYclovir (VALTREX) 1000 MG tablet Take 1,000 mg by mouth daily.     Historical Provider, MD    Allergies Review of patient's allergies indicates no known allergies.  History reviewed. No  pertinent family history.  Social History Social History  Substance Use Topics  . Smoking status: Former Smoker    Packs/day: 0.50  . Smokeless tobacco: Never Used  . Alcohol use Yes     Comment: twice/weekly    Review of Systems  Constitutional: No fever/chills. Eyes: No visual changes. ENT: No sore throat. Cardiovascular: Denies chest pain. Respiratory: Denies shortness of breath. Gastrointestinal: Positive for abdominal pain and rectal bleeding.  No nausea, no vomiting.  No diarrhea.  No constipation. Genitourinary: Negative for dysuria. Musculoskeletal: Negative for back pain. Skin: Negative for rash. Neurological: Negative for headaches, focal weakness or numbness.  10-point ROS otherwise negative.  ____________________________________________   PHYSICAL EXAM:  VITAL SIGNS: ED Triage Vitals  Enc Vitals Group     BP 08/23/15 2234 118/62     Pulse Rate 08/23/15 2234 (!) 115     Resp 08/23/15 2234 18     Temp 08/23/15 2234 (!) 100.8 F (38.2 C)     Temp Source 08/23/15 2234 Oral     SpO2 08/23/15 2234 98 %     Weight 08/23/15 2234 162 lb (73.5 kg)     Height 08/23/15 2234 5\' 2"  (1.575 m)     Head Circumference --      Peak Flow --      Pain Score 08/23/15 2236 8     Pain Loc --      Pain Edu? --      Excl. in GC? --     Constitutional: Alert and oriented. Well appearing and in no acute distress. Eyes: Conjunctivae are normal. PERRL. EOMI. No scleral icterus. Head: Atraumatic. Nose: No congestion/rhinnorhea. Mouth/Throat: Mucous membranes are moist.  Oropharynx non-erythematous. Neck: No stridor.   Cardiovascular: Normal rate, regular rhythm. Grossly normal heart sounds.  Good peripheral circulation. Respiratory: Normal respiratory effort.  No retractions. Lungs CTAB. Gastrointestinal: Soft and mildly tender to palpation right upper quadrant without rebound or guarding. Hepatomegaly. No distention. No abdominal bruits. No CVA tenderness. Musculoskeletal:  No lower extremity tenderness nor edema.  No joint effusions. Neurologic:  Normal speech and language. No gross focal neurologic deficits are appreciated. No gait instability. Skin:  Skin is warm, dry and intact. No rash noted. No jaundice. Psychiatric: Mood and affect are normal. Speech and behavior are normal.  ____________________________________________   LABS (all labs ordered are listed, but only abnormal results are displayed)  Labs Reviewed  LACTIC ACID, PLASMA - Abnormal; Notable for the following:       Result Value   Lactic Acid, Venous 2.0 (*)    All other components within normal limits  AMMONIA - Abnormal; Notable for the following:    Ammonia 46 (*)    All other components within normal limits  URINALYSIS COMPLETEWITH MICROSCOPIC ([facility] ONLY) - Abnormal; Notable for the following:    Color, Urine YELLOW (*)    APPearance CLEAR (*)    Hgb urine dipstick 2+ (*)    Bacteria, UA RARE (*)    Squamous Epithelial / LPF  6-30 (*)    All other components within normal limits  CBC WITH DIFFERENTIAL/PLATELET - Abnormal; Notable for the following:    WBC 12.3 (*)    MCV 103.2 (*)    MCH 35.5 (*)    Neutro Abs 7.5 (*)    Monocytes Absolute 1.2 (*)    All other components within normal limits  COMPREHENSIVE METABOLIC PANEL - Abnormal; Notable for the following:    Sodium 132 (*)    Potassium 2.9 (*)    Glucose, Bld 142 (*)    BUN <5 (*)    Calcium 8.6 (*)    Albumin 3.2 (*)    AST 125 (*)    Alkaline Phosphatase 157 (*)    Total Bilirubin 1.3 (*)    All other components within normal limits  ETHANOL - Abnormal; Notable for the following:    Alcohol, Ethyl (B) 147 (*)    All other components within normal limits  PROTIME-INR - Abnormal; Notable for the following:    Prothrombin Time 16.5 (*)    All other components within normal limits  LACTIC ACID, PLASMA  URINE DRUG SCREEN, QUALITATIVE ([facility] ONLY)  LIPASE, BLOOD  POC URINE PREG, ED  TYPE AND SCREEN    ____________________________________________  EKG  None ____________________________________________  RADIOLOGY  None ____________________________________________   PROCEDURES  Procedure(s) performed:   Rectal exam: Small external, nonthrombosed hemorrhoid. Rectal exam tender to palpation. Scant amount of right red blood per rectum noted.  Procedures  Critical Care performed: No  ____________________________________________   INITIAL IMPRESSION / ASSESSMENT AND PLAN / ED COURSE  Pertinent labs & imaging results that were available during my care of the patient were reviewed by me and considered in my medical decision making (see chart for details).  30 year old female with a history of cirrhosis who presents with fever, abdominal pain and rectal bleeding. Symptoms are concerning for SBP. Patient received an IV dose of Rocephin approximately 6 hours ago. She is hemodynamically stable, desires to remain at our facility because it is closer to her home. Will discuss with hospitalist to evaluate patient in the emergency department for admission.  Clinical Course  Comment By Time  Patient had a large bloody bowel movement without stool. Discussed with hospitalist who recommends transferring patient to another facility as we do not have GI coverage this weekend. Patient receives the majority of her medical care at [facility] and desires to go there. Will contact [facility] transfer center to effect transfer. Irean Hong, MD 07/29 0151  Discussed with [facility] ED attending who advises me to speak with family medicine for direct admission. Either way they will be able to accept patient in transfer. Irean Hong, MD 07/29 0207  [facility] transfer center for up-to-date. Evidently they have been paging the GI service without success; however, GI service is not admitting service. I asked them to page at the family medicine service but they are telling me per their protocol I will need to speak  with the ED attending again. Irean Hong, MD 07/29 [phone]  Spoke again with Dr. Cardell Peach from the [facility] ED who except patient in transfer. Apologized for the delay; patient has been sleeping and has had no further bloody bowel movements. Irean Hong, MD 07/29 0423  There was a local EMS transportation delay. I am told transport is coming shortly. Patient is sleeping in no acute distress. Irean Hong, MD 07/29 [phone]     ____________________________________________   FINAL CLINICAL IMPRESSION(S) /  ED DIAGNOSES  Final diagnoses:  Acute GI bleeding  Fever, unspecified fever cause  SBP (spontaneous bacterial peritonitis) (HCC)      NEW MEDICATIONS STARTED DURING THIS VISIT:  New Prescriptions   No medications on file     Note:  This document was prepared using Dragon voice recognition software and may include unintentional dictation errors.    Irean Hong, MD 08/24/15 [phone]

## 2015-08-23 NOTE — ED Triage Notes (Signed)
Pt c/o RUQ pain and bright red blood in stool. Pt was seen at [facility] ED today and worked up there including and was discharged. Pt reports [place] at [facility] today and was told she has a blood clot in liver. Has had some vomiting.

## 2015-08-23 NOTE — ED Notes (Signed)
Pt returned to room. Dr. Mayford Knife at bedside. Pt told Dr. Mayford Knife that she was only going outside to smoke and that she was told she could smoke after her antibiotics were finished.

## 2015-08-24 LAB — COMPREHENSIVE METABOLIC PANEL
ALBUMIN: 3.2 g/dL — AB (ref 3.5–5.0)
ALT: 48 U/L (ref 14–54)
ANION GAP: 8 (ref 5–15)
AST: 125 U/L — ABNORMAL HIGH (ref 15–41)
Alkaline Phosphatase: 157 U/L — ABNORMAL HIGH (ref 38–126)
BUN: <5 mg/dL — ABNORMAL LOW (ref 6–20)
CO2: 22 mmol/L (ref 22–32)
Calcium: 8.6 mg/dL — ABNORMAL LOW (ref 8.9–10.3)
Chloride: 102 mmol/L (ref 101–111)
Creatinine, Ser: 0.46 mg/dL (ref 0.44–1.00)
GFR calc Af Amer: >60 mL/min (ref >60)
GFR calc non Af Amer: >60 mL/min (ref >60)
GLUCOSE: 142 mg/dL — AB (ref 65–99)
POTASSIUM: 2.9 mmol/L — AB (ref 3.5–5.1)
SODIUM: 132 mmol/L — AB (ref 135–145)
Total Bilirubin: 1.3 mg/dL — ABNORMAL HIGH (ref 0.3–1.2)
Total Protein: 7.3 g/dL (ref 6.5–8.1)

## 2015-08-24 LAB — TYPE AND SCREEN
ABO/RH(D): O POS
Antibody Screen: NEGATIVE

## 2015-08-24 LAB — URINALYSIS COMPLETE WITH MICROSCOPIC (ARMC ONLY)
BILIRUBIN URINE: NEGATIVE
Glucose, UA: NEGATIVE mg/dL
Ketones, ur: NEGATIVE mg/dL
LEUKOCYTES UA: NEGATIVE
NITRITE: NEGATIVE
PH: 6 (ref 5.0–8.0)
Protein, ur: NEGATIVE mg/dL
Specific Gravity, Urine: 1.01 (ref 1.005–1.030)

## 2015-08-24 LAB — URINE DRUG SCREEN, QUALITATIVE (ARMC ONLY)
Amphetamines, Ur Screen: NOT DETECTED
BARBITURATES, UR SCREEN: NOT DETECTED
Benzodiazepine, Ur Scrn: NOT DETECTED
CANNABINOID 50 NG, UR ~~LOC~~: NOT DETECTED
COCAINE METABOLITE, UR ~~LOC~~: NOT DETECTED
MDMA (Ecstasy)Ur Screen: NOT DETECTED
Methadone Scn, Ur: NOT DETECTED
OPIATE, UR SCREEN: NOT DETECTED
Phencyclidine (PCP) Ur S: NOT DETECTED
TRICYCLIC, UR SCREEN: NOT DETECTED

## 2015-08-24 LAB — CBC WITH DIFFERENTIAL/PLATELET
BASOS PCT: 1 %
Basophils Absolute: 0.1 10*3/uL (ref 0–0.1)
EOS ABS: 0.2 10*3/uL (ref 0–0.7)
EOS PCT: 2 %
HCT: 40.4 % (ref 35.0–47.0)
Hemoglobin: 13.9 g/dL (ref 12.0–16.0)
Lymphocytes Relative: 27 %
Lymphs Abs: 3.3 10*3/uL (ref 1.0–3.6)
MCH: 35.5 pg — ABNORMAL HIGH (ref 26.0–34.0)
MCHC: 34.4 g/dL (ref 32.0–36.0)
MCV: 103.2 fL — ABNORMAL HIGH (ref 80.0–100.0)
MONO ABS: 1.2 10*3/uL — AB (ref 0.2–0.9)
MONOS PCT: 10 %
NEUTROS PCT: 60 %
Neutro Abs: 7.5 10*3/uL — ABNORMAL HIGH (ref 1.4–6.5)
PLATELETS: 170 10*3/uL (ref 150–440)
RBC: 3.92 MIL/uL (ref 3.80–5.20)
RDW: 13.7 % (ref 11.5–14.5)
WBC: 12.3 10*3/uL — ABNORMAL HIGH (ref 3.6–11.0)

## 2015-08-24 LAB — POC URINE PREG, ED: Preg Test, Ur: NEGATIVE

## 2015-08-24 LAB — LIPASE, BLOOD: Lipase: 33 U/L (ref 11–51)

## 2015-08-24 LAB — AMMONIA: Ammonia: 46 umol/L — ABNORMAL HIGH (ref 9–35)

## 2015-08-24 LAB — ETHANOL: Alcohol, Ethyl (B): 147 mg/dL — ABNORMAL HIGH (ref <5)

## 2015-08-24 LAB — LACTIC ACID, PLASMA: Lactic Acid, Venous: 1.9 mmol/L (ref 0.5–1.9)

## 2015-08-24 LAB — PROTIME-INR
INR: 1.32
PROTHROMBIN TIME: 16.5 s — AB (ref 11.4–15.2)

## 2015-08-24 MED ORDER — SODIUM CHLORIDE 0.9 % IV BOLUS (SEPSIS)
1000.0000 mL | Freq: Once | INTRAVENOUS | Status: AC
  Administered 2015-08-24: 1000 mL via INTRAVENOUS

## 2015-08-24 MED ORDER — POTASSIUM CHLORIDE CRYS ER 20 MEQ PO TBCR
40.0000 meq | EXTENDED_RELEASE_TABLET | Freq: Once | ORAL | Status: AC
  Administered 2015-08-24: 40 meq via ORAL
  Filled 2015-08-24: qty 2

## 2015-08-24 MED ORDER — MORPHINE SULFATE (PF) 4 MG/ML IV SOLN
4.0000 mg | Freq: Once | INTRAVENOUS | Status: AC
  Administered 2015-08-24: 4 mg via INTRAVENOUS
  Filled 2015-08-24: qty 1

## 2015-08-24 MED ORDER — FAMOTIDINE IN NACL 20-0.9 MG/50ML-% IV SOLN
20.0000 mg | Freq: Once | INTRAVENOUS | Status: AC
  Administered 2015-08-24: 20 mg via INTRAVENOUS
  Filled 2015-08-24: qty 50

## 2015-08-24 NOTE — ED Notes (Signed)
Pt discharged via EMS to [facility] for GI treatment   Report has already been called by Wynona Canes RN   VSS afebrile  No verbalized needs or complaints upon departure

## 2015-08-27 LAB — POCT PREGNANCY, URINE: PREG TEST UR: NEGATIVE

## 2015-09-09 ENCOUNTER — Ambulatory Visit: Payer: Medicaid Other | Admitting: Internal Medicine

## 2016-03-18 ENCOUNTER — Emergency Department
Admission: EM | Admit: 2016-03-18 | Discharge: 2016-03-18 | Disposition: A | Discharge status: Home / Self Care | Payer: Medicaid Other | Attending: Emergency Medicine | Admitting: Emergency Medicine

## 2016-03-18 ENCOUNTER — Encounter: Payer: Self-pay | Admitting: *Deleted

## 2016-03-18 DIAGNOSIS — Z87891 Personal history of nicotine dependence: Secondary | ICD-10-CM | POA: Insufficient documentation

## 2016-03-18 DIAGNOSIS — R110 Nausea: Secondary | ICD-10-CM | POA: Diagnosis present

## 2016-03-18 DIAGNOSIS — Z79899 Other long term (current) drug therapy: Secondary | ICD-10-CM | POA: Diagnosis not present

## 2016-03-18 DIAGNOSIS — J111 Influenza due to unidentified influenza virus with other respiratory manifestations: Secondary | ICD-10-CM | POA: Insufficient documentation

## 2016-03-18 MED ORDER — DIPHENHYDRAMINE HCL 12.5 MG/5ML PO ELIX
25.0000 mg | ORAL_SOLUTION | Freq: Once | ORAL | Status: AC
Start: 2016-03-18 — End: 2016-03-18
  Administered 2016-03-18: 25 mg via ORAL
  Filled 2016-03-18: qty 10

## 2016-03-18 MED ORDER — FLUTICASONE PROPIONATE 50 MCG/ACT NA SUSP: 2.0000 | Freq: Every day | NASAL | 0 refills | Status: AC

## 2016-03-18 MED ORDER — ALBUTEROL SULFATE HFA 108 (90 BASE) MCG/ACT IN AERS: 2.0000 | INHALATION_SPRAY | Freq: Four times a day (QID) | RESPIRATORY_TRACT | 0 refills | Status: AC | PRN

## 2016-03-18 MED ORDER — ALUM & MAG HYDROXIDE-SIMETH 200-200-20 MG/5ML PO SUSP
15.0000 mL | Freq: Once | ORAL | Status: AC
  Administered 2016-03-18: 15 mL via ORAL
  Filled 2016-03-18: qty 30

## 2016-03-18 MED ORDER — BENZONATATE 100 MG PO CAPS: 100.0000 mg | ORAL_CAPSULE | Freq: Three times a day (TID) | ORAL | 0 refills | Status: AC | PRN

## 2016-03-18 MED ORDER — LIDOCAINE VISCOUS 2 % MT SOLN
15.0000 mL | Freq: Once | OROMUCOSAL | Status: AC
  Administered 2016-03-18: 15 mL via OROMUCOSAL
  Filled 2016-03-18: qty 15

## 2016-03-18 MED ORDER — ONDANSETRON 4 MG PO TBDP: 4.0000 mg | ORAL_TABLET | Freq: Three times a day (TID) | ORAL | 0 refills | Status: AC | PRN

## 2016-03-18 NOTE — ED Provider Notes (Signed)
[facility] Emergency Department Provider Note ____________________________________________  Time seen: 1016  I have reviewed the triage vital signs and the nursing notes.  HISTORY  Chief Complaint  Influenza  HPI Janet Weiss is a 31 y.o. female presents to the ED for evaluation of flulike symptoms. Patient denies onset of sore throat, cough, congestion, body aches, and fevers about 4 days prior. She does admit to receive the seasonal flu vaccine. She's been taking ibuprofen and using her son's pro-air for symptom relief. She describes a Tmax of 102.10F. She's also noted some nausea without vomiting or diarrhea.  Past Medical History:  Diagnosis Date  . Cirrhosis (HCC)   . GERD (gastroesophageal reflux disease)     There are no active problems to display for this patient.   Past Surgical History:  Procedure Laterality Date  . ESOPHAGOGASTRODUODENOSCOPY N/A 06/28/2014   Procedure: ESOPHAGOGASTRODUODENOSCOPY (EGD);  Surgeon: Wallace Cullens, MD;  Location: [facility] ENDOSCOPY;  Service: Gastroenterology;  Laterality: N/A;    Prior to Admission medications   Medication Sig Start Date End Date Taking? Authorizing Provider  albuterol (PROVENTIL HFA;VENTOLIN HFA) 108 (90 Base) MCG/ACT inhaler Inhale 2 puffs into the lungs every 6 (six) hours as needed for wheezing or shortness of breath. 03/18/16   Junie Engram V Bacon Korvin Valentine, PA-C  benzonatate (TESSALON PERLES) 100 MG capsule Take 1 capsule (100 mg total) by mouth 3 (three) times daily as needed for cough (Take 1-2 per dose). 03/18/16   Niketa Turner V Bacon Gurtej Noyola, PA-C  busPIRone (BUSPAR) 7.5 MG tablet Take 7.5 mg by mouth 2 (two) times daily as needed (anxiety).     Historical Provider, MD  dexlansoprazole (DEXILANT) 60 MG capsule Take 60 mg by mouth daily.    Historical Provider, MD  dibucaine (NUPERCAINAL) 1 % OINT Place 1 application rectally 3 (three) times daily as needed for pain. 04/17/15   Reylynn Vanalstine V Bacon Londan Coplen, PA-C   fluticasone (FLONASE) 50 MCG/ACT nasal spray Place 2 sprays into both nostrils daily. 03/18/16   Rayford Williamsen V Bacon Agnes Brightbill, PA-C  furosemide (LASIX) 40 MG tablet Take 40 mg by mouth daily.     Historical Provider, MD  Boris Lown Oil 300 MG CAPS Take 350 mg by mouth daily.     Historical Provider, MD  lactulose (CHRONULAC) 10 GM/15ML solution Take 30 mLs by mouth 3 (three) times daily. 08/05/15   Historical Provider, MD  nadolol (CORGARD) 20 MG tablet Take 20 mg by mouth daily. 08/05/15 09/04/15  Historical Provider, MD  ondansetron (ZOFRAN ODT) 4 MG disintegrating tablet Take 1 tablet (4 mg total) by mouth every 8 (eight) hours as needed for nausea or vomiting. 03/18/16   Charlesetta Ivory Ida Milbrath, PA-C  pantoprazole (PROTONIX) 40 MG tablet Take 40 mg by mouth 2 (two) times daily. 08/05/15   Historical Provider, MD  potassium chloride (K-DUR) 10 MEQ tablet Take 10 mEq by mouth daily. 08/05/15 09/04/15  Historical Provider, MD  Prenatal Multivit-Min-Fe-FA (PRE-NATAL PO) Take 1 tablet by mouth daily.    Historical Provider, MD  ranitidine (ZANTAC) 300 MG tablet Take 300 mg by mouth at bedtime. 08/05/15 08/04/16  Historical Provider, MD  sertraline (ZOLOFT) 25 MG tablet Take 25 mg by mouth 2 (two) times daily.     Historical Provider, MD  spironolactone (ALDACTONE) 50 MG tablet Take 50 mg by mouth daily.    Historical Provider, MD  sulfamethoxazole-trimethoprim (BACTRIM DS,SEPTRA DS) 800-160 MG per tablet Take 1 tablet by mouth 2 (two) times daily as needed (cyists). X 3  days    Historical Provider, MD  traMADol (ULTRAM) 50 MG tablet Take 50 mg by mouth every 6 (six) hours as needed. 08/23/15   Historical Provider, MD  valACYclovir (VALTREX) 1000 MG tablet Take 1,000 mg by mouth daily.     Historical Provider, MD    Allergies Patient has no known allergies.  No family history on file.  Social History Social History  Substance Use Topics  . Smoking status: Former Smoker    Packs/day: 0.50  . Smokeless tobacco:  Never Used  . Alcohol use Yes     Comment: twice/weekly    Review of Systems  Constitutional: Positive for fever. Eyes: Negative for visual changes. ENT: Positive for sore throat. Cardiovascular: Negative for chest pain. Respiratory: Negative for shortness of breath. Reports nonproductive cough. Gastrointestinal: Negative for abdominal pain, vomiting and diarrhea. Genitourinary: Negative for dysuria. Musculoskeletal: Negative for back pain. Skin: Negative for rash. Neurological: Negative for headaches, focal weakness or numbness. ____________________________________________  PHYSICAL EXAM:  VITAL SIGNS: ED Triage Vitals  Enc Vitals Group     BP 03/18/16 0933 96/60     Pulse Rate 03/18/16 0933 99     Resp 03/18/16 0933 20     Temp 03/18/16 0933 99.3 F (37.4 C)     Temp Source 03/18/16 0933 Oral     SpO2 03/18/16 0933 100 %     Weight 03/18/16 0934 164 lb (74.4 kg)     Height 03/18/16 0934 5\' 2"  (1.575 m)     Head Circumference --      Peak Flow --      Pain Score 03/18/16 1047 2     Pain Loc --      Pain Edu? --      Excl. in GC? --     Constitutional: Alert and oriented. Well appearing and in no distress. Head: Normocephalic and atraumatic. Eyes: Conjunctivae are normal. PERRL. Normal extraocular movements Ears: Canals clear. TMs intact bilaterally. Nose: No congestion/rhinorrhea/epistaxis. Mouth/Throat: Mucous membranes are moist. Uvula is midline tonsils are flat. No oropharyngeal edema or exudates noted. Neck: Supple. No thyromegaly. Hematological/Lymphatic/Immunological: No cervical lymphadenopathy. Cardiovascular: Normal rate, regular rhythm. Normal distal pulses. Respiratory: Normal respiratory effort. No wheezes/rales/rhonchi. Gastrointestinal: Soft and nontender. No distention. Musculoskeletal: Nontender with normal range of motion in all extremities.  Neurologic:  Normal gait without ataxia. Normal speech and language. No gross focal neurologic deficits  are appreciated. Skin:  Skin is warm, dry and intact. No rash noted. ____________________________________________  PROCEDURES  Gargle of 15 ml 2% viscous lido/15 ml Maalox/10 ml Diphenhydramine         ____________________________________________  INITIAL IMPRESSION / ASSESSMENT AND PLAN / ED COURSE  Patient presents to the ED for evaluation of flu-like symptoms. Her symptoms are consistent with influenza. She will be discharged with prescriptions for albuterol, Zofran, Tessalon, and Flonase. She will follow-up with her PCP or return to the ED as needed.  ____________________________________________  FINAL CLINICAL IMPRESSION(S) / ED DIAGNOSES  Final diagnoses:  Influenza      Lissa HoardJenise V Bacon Kallyn Demarcus, PA-C 03/18/16 1300    Emily FilbertJonathan E Williams, MD 03/18/16 1344

## 2016-03-18 NOTE — ED Notes (Signed)
See triage note  States she developed fever with body aches,congestion and sore throat about3-4 days ago  Low grade temp on arrival

## 2016-03-18 NOTE — Discharge Instructions (Signed)
Continue to monitor and treat fevers with Ibuprofen. Dose the prescription meds along with OTC Delsym (dextromethorphan) for cough, Benadryl, (diphenhydramine) for runny nose, and pseudoephedrine for sinus congestion. Take the prescription meds as directed. Hydrate to prevent dehydration. Follow-up with your provider or return to the ED as needed.

## 2016-03-18 NOTE — ED Triage Notes (Signed)
Pt complains of sore throat, cough, congestion and body aches starting 4 days ago

## 2016-11-22 ENCOUNTER — Emergency Department
Admission: EM | Admit: 2016-11-22 | Discharge: 2016-11-22 | Disposition: A | Discharge status: Home / Self Care | Payer: Medicaid Other | Attending: Emergency Medicine | Admitting: Emergency Medicine

## 2016-11-22 ENCOUNTER — Emergency Department: Payer: Medicaid Other

## 2016-11-22 DIAGNOSIS — M25562 Pain in left knee: Secondary | ICD-10-CM | POA: Diagnosis not present

## 2016-11-22 DIAGNOSIS — M25511 Pain in right shoulder: Secondary | ICD-10-CM | POA: Diagnosis not present

## 2016-11-22 DIAGNOSIS — Z79899 Other long term (current) drug therapy: Secondary | ICD-10-CM | POA: Diagnosis not present

## 2016-11-22 DIAGNOSIS — Z87891 Personal history of nicotine dependence: Secondary | ICD-10-CM | POA: Diagnosis not present

## 2016-11-22 DIAGNOSIS — K625 Hemorrhage of anus and rectum: Secondary | ICD-10-CM | POA: Diagnosis present

## 2016-11-22 LAB — TYPE AND SCREEN
ABO/RH(D): O POS
Antibody Screen: NEGATIVE

## 2016-11-22 LAB — CBC
HCT: 29.3 % — ABNORMAL LOW (ref 35.0–47.0)
Hemoglobin: 10 g/dL — ABNORMAL LOW (ref 12.0–16.0)
MCH: 31.8 pg (ref 26.0–34.0)
MCHC: 34.1 g/dL (ref 32.0–36.0)
MCV: 93.2 fL (ref 80.0–100.0)
PLATELETS: 112 10*3/uL — AB (ref 150–440)
RBC: 3.14 MIL/uL — ABNORMAL LOW (ref 3.80–5.20)
RDW: 20 % — ABNORMAL HIGH (ref 11.5–14.5)
WBC: 7.6 10*3/uL (ref 3.6–11.0)

## 2016-11-22 LAB — COMPREHENSIVE METABOLIC PANEL
ALBUMIN: 4.4 g/dL (ref 3.5–5.0)
ALT: 25 U/L (ref 14–54)
ANION GAP: 11 (ref 5–15)
AST: 89 U/L — AB (ref 15–41)
Alkaline Phosphatase: 106 U/L (ref 38–126)
BUN: <5 mg/dL — ABNORMAL LOW (ref 6–20)
CALCIUM: 9.4 mg/dL (ref 8.9–10.3)
CHLORIDE: 103 mmol/L (ref 101–111)
CO2: 27 mmol/L (ref 22–32)
CREATININE: 0.71 mg/dL (ref 0.44–1.00)
GFR calc Af Amer: >60 mL/min (ref >60)
GFR calc non Af Amer: >60 mL/min (ref >60)
GLUCOSE: 111 mg/dL — AB (ref 65–99)
POTASSIUM: 3.4 mmol/L — AB (ref 3.5–5.1)
Sodium: 141 mmol/L (ref 135–145)
Total Bilirubin: 2.5 mg/dL — ABNORMAL HIGH (ref 0.3–1.2)
Total Protein: 8.6 g/dL — ABNORMAL HIGH (ref 6.5–8.1)

## 2016-11-22 LAB — PROTIME-INR
INR: 1.31
PROTHROMBIN TIME: 16.2 s — AB (ref 11.4–15.2)

## 2016-11-22 LAB — HCG, QUANTITATIVE, PREGNANCY: hCG, Beta Chain, Quant, S: <1 m[IU]/mL (ref <5)

## 2016-11-22 MED ORDER — IBUPROFEN 600 MG PO TABS: 600.0000 mg | ORAL_TABLET | Freq: Four times a day (QID) | ORAL | 0 refills | Status: DC | PRN

## 2016-11-22 MED ORDER — IBUPROFEN 600 MG PO TABS
600.0000 mg | ORAL_TABLET | Freq: Once | ORAL | Status: AC
  Administered 2016-11-22: 600 mg via ORAL
  Filled 2016-11-22: qty 1

## 2016-11-22 NOTE — ED Notes (Addendum)
Patient denies nausea, vomiting and diarrhea. Reports right shoulder pain 8/10. States "I think I fractured my right arm, it runs in the family."

## 2016-11-22 NOTE — ED Triage Notes (Signed)
Pt in via EMS with complaints of liver problems, arm pain, rectal bleeding, knee pain. Has hx of anemia. BP 120/75, HR 80.  EMS reports upon their arrival to the house pt was drinking a monster energy drink. EMS asked pt to discard drink before transport and patient became angry and started throwing things. Pt upset in the ED. Pt states, "yout think I wont speak my mind because he here" and points to police officer. RN asked pt to step back and we would be with her shortly.

## 2016-11-22 NOTE — ED Triage Notes (Signed)
Pt arrived via EMS from home with reports of cirrhosis, rectal bleeding and generalized body aches and pink eye. Pt reports multiple medical complaints, states she has been falling frequently. Pt states she had GI bleed in the past with blood transfusion. Pt states she has started back drinking recently last drink was at 2am.

## 2016-11-22 NOTE — ED Notes (Signed)
MD placed hold on IV's access for now

## 2016-11-22 NOTE — ED Notes (Addendum)
Patient does not appear to be in any acute distress at time of discharge. Patient ambulatory to lobby with steady gate. Patient denies any comments or concerns regarding discharge.  

## 2016-11-22 NOTE — ED Notes (Signed)
MD performed rectal exam and retrieved stool sample, hemoccult blood test negative.

## 2016-11-22 NOTE — ED Notes (Signed)
Patient offered wheelchair, refused wheelchair and stated "I'll walk out on my own, I don't want or need a wheelchair"

## 2016-11-22 NOTE — ED Provider Notes (Addendum)
[facility] Emergency Department Provider Note  Time seen: 12:54 PM  I have reviewed the triage vital signs and the nursing notes.   HISTORY  Chief Complaint Rectal Bleeding and Cirrhosis    HPI Janet Weiss is a 31 y.o. female with a past medical history of alcoholic cirrhosis, gastric reflux, presents to the emergency department with multiple complaints.  According to the patient for the past few years she has been experiencing left knee pain, but feels like it is getting worse.  She states the orthopedist stopped doing steroid injections because they were not helping.  She also states for the past 5 days she has been experiencing right shoulder pain, worse with movement of the right shoulder.  Denies any injury or trauma.  Patient also states this morning she thinks she saw blood in her stool and a bowel movement.  Has a history of GI bleeding in the past.  She also states she drank alcohol last night last drink was around 2:00 this morning and has not eaten in 2 or 3 days.  Denies any increased abdominal pain.  Denies any chest pain.  Denies any vomiting.   Past Medical History:  Diagnosis Date  . Cirrhosis (HCC)   . GERD (gastroesophageal reflux disease)     There are no active problems to display for this patient.   Past Surgical History:  Procedure Laterality Date  . ESOPHAGOGASTRODUODENOSCOPY N/A 06/28/2014   Procedure: ESOPHAGOGASTRODUODENOSCOPY (EGD);  Surgeon: Wallace Cullens, MD;  Location: [facility] ENDOSCOPY;  Service: Gastroenterology;  Laterality: N/A;    Prior to Admission medications   Medication Sig Start Date End Date Taking? Authorizing Provider  albuterol (PROVENTIL HFA;VENTOLIN HFA) 108 (90 Base) MCG/ACT inhaler Inhale 2 puffs into the lungs every 6 (six) hours as needed for wheezing or shortness of breath. 03/18/16   Menshew, Charlesetta Ivory, PA-C  benzonatate (TESSALON PERLES) 100 MG capsule Take 1 capsule (100 mg total) by mouth 3 (three) times  daily as needed for cough (Take 1-2 per dose). 03/18/16   Menshew, Charlesetta Ivory, PA-C  busPIRone (BUSPAR) 7.5 MG tablet Take 7.5 mg by mouth 2 (two) times daily as needed (anxiety).     [provider]  dexlansoprazole (DEXILANT) 60 MG capsule Take 60 mg by mouth daily.    [provider]  dibucaine (NUPERCAINAL) 1 % OINT Place 1 application rectally 3 (three) times daily as needed for pain. 04/17/15   Menshew, Charlesetta Ivory, PA-C  fluticasone (FLONASE) 50 MCG/ACT nasal spray Place 2 sprays into both nostrils daily. 03/18/16   Menshew, Charlesetta Ivory, PA-C  furosemide (LASIX) 40 MG tablet Take 40 mg by mouth daily.     [provider]  Boris Lown Oil 300 MG CAPS Take 350 mg by mouth daily.     [provider]  lactulose (CHRONULAC) 10 GM/15ML solution Take 30 mLs by mouth 3 (three) times daily. 08/05/15   [provider]  nadolol (CORGARD) 20 MG tablet Take 20 mg by mouth daily. 08/05/15 09/04/15  [provider]  ondansetron (ZOFRAN ODT) 4 MG disintegrating tablet Take 1 tablet (4 mg total) by mouth every 8 (eight) hours as needed for nausea or vomiting. 03/18/16   Menshew, Charlesetta Ivory, PA-C  pantoprazole (PROTONIX) 40 MG tablet Take 40 mg by mouth 2 (two) times daily. 08/05/15   [provider]  potassium chloride (K-DUR) 10 MEQ tablet Take 10 mEq by mouth daily. 08/05/15 09/04/15  [provider]  Prenatal Multivit-Min-Fe-FA (PRE-NATAL PO) Take 1 tablet by mouth daily.    [provider]  ranitidine (ZANTAC) 300 MG tablet Take 300 mg by mouth at bedtime. 08/05/15 08/04/16  [provider]  sertraline (ZOLOFT) 25 MG tablet Take 25 mg by mouth 2 (two) times daily.     [provider]  spironolactone (ALDACTONE) 50 MG tablet Take 50 mg by mouth daily.    [provider]  sulfamethoxazole-trimethoprim (BACTRIM DS,SEPTRA DS) 800-160 MG per tablet Take 1 tablet by mouth 2 (two) times daily as needed (cyists).  X 3 days    [provider]  traMADol (ULTRAM) 50 MG tablet Take 50 mg by mouth every 6 (six) hours as needed. 08/23/15   [provider]  valACYclovir (VALTREX) 1000 MG tablet Take 1,000 mg by mouth daily.     [provider]    No Known Allergies  No family history on file.  Social History Social History  Substance Use Topics  . Smoking status: Former Smoker    Packs/day: 0.50  . Smokeless tobacco: Never Used  . Alcohol use Yes     Comment: twice/weekly    Review of Systems Constitutional: Negative for fever. Cardiovascular: Negative for chest pain. Respiratory: Negative for shortness of breath. Gastrointestinal: Negative for abdominal pain, vomiting possible blood in her stool this morning. Musculoskeletal: Right shoulder pain.  Left knee pain. Neurological: Negative for headache All other ROS negative  ____________________________________________   PHYSICAL EXAM:  VITAL SIGNS: ED Triage Vitals  Enc Vitals Group     BP 11/22/16 1039 111/62     Pulse Rate 11/22/16 1039 82     Resp 11/22/16 1039 18     Temp 11/22/16 1039 98.5 F (36.9 C)     Temp Source 11/22/16 1039 Oral     SpO2 11/22/16 1039 99 %     Weight 11/22/16 1040 140 lb (63.5 kg)     Height 11/22/16 1040 5\' 5"  (1.651 m)     Head Circumference --      Peak Flow --      Pain Score 11/22/16 1038 10     Pain Loc --      Pain Edu? --      Excl. in GC? --     Constitutional: Alert and oriented. Well appearing and in no distress. Eyes: Normal exam ENT   Head: Normocephalic and atraumatic.   Mouth/Throat: Mucous membranes are moist. Cardiovascular: Normal rate, regular rhythm. No murmur Respiratory: Normal respiratory effort without tachypnea nor retractions. Breath sounds are clear Gastrointestinal: Soft, mildly distended but dull percussion.  Nontender to palpation. Musculoskeletal: Moderate pain with movement of the left knee, moderate tenderness to the right shoulder  worse with movement of the right shoulder but good range of motion.  Do not suspect fracture dislocation. Neurologic:  Normal speech and language. No gross focal neurologic deficits Skin:  Skin is warm, dry and intact.  Psychiatric: Mood and affect are normal.   ____________________________________________   RADIOLOGY  X-ray negative  ____________________________________________   INITIAL IMPRESSION / ASSESSMENT AND PLAN / ED COURSE  Pertinent labs & imaging results that were available during my care of the patient were reviewed by me and considered in my medical decision making (see chart for details).  Patient presents to the emergency department for left knee pain, right shoulder pain, possible blood in her stool today.  Differential would include arthritis, fracture/dislocation, GI bleeding.  On exam patient has mild tenderness in the right shoulder  worse with range of motion we will obtain an x-ray of the right shoulder.  Left knee pain patient admits has been ongoing for years with no acute worsening, I do not believe further x-ray imaging would be of benefit to the patient.  Patient also states possible blood in her stool this morning.  On rectal examination there are no external hemorrhoids visible.  Stool is light brown in color and is guaiac negative.  Patient is also asking for something to eat states she has not eaten in 2-3 days because of a lot of stress at home.  Patient follows up with GI medicine at [facility], but states she is having trouble getting there as she argues with her mother frequently and she is the only one who has a car currently.  When asked specifically what brought the patient in today she says everything.  We will check labs, right shoulder x-ray and closely monitor in the emergency department.  Labs have resulted showing no significant change in LFTs.  Normal renal function.  Mild decrease in H&H however rectal exam shows light brown stool, guaiac negative.  X-ray  is normal.  Overall the patient appears well, no distress.  I suspect her joint pain is likely due to worsening arthritis which the patient has a history of.  I suggested following up with orthopedics.  Patient states she spoke to her gastroenterologist at [facility] 2 days ago and they are working on arranging an appointment.  Is requesting something for pain.  I had a long discussion with the patient that she should avoid Tylenol products due to her liver.  I also discussed that it is not safe to take ibuprofen based products because they can cause GI bleeding.  I discussed with patient that I would prefer that she not take either but I also do not believe the patient should be on narcotic medications when there is no clear indication.  The patient states she needs something.  I believe ibuprofen is likely safer for the patient and Tylenol with her liver dysfunction.  We will write for a short course of ibuprofen I discussed with the patient that she cannot take this medication every day.  I discussed the patient needs follow-up with GI medicine and follow the recommendations.  I received a phone call from the front desk of the ER apparently the patient had ordered a pizza to be delivered to her room in the emergency department.    ____________________________________________   FINAL CLINICAL IMPRESSION(S) / ED DIAGNOSES  Shoulder pain Knee pain    Minna Antis, MD 11/22/16 1301    Minna Antis, MD 11/22/16 1349    Minna Antis, MD 11/22/16 1358

## 2017-04-15 ENCOUNTER — Other Ambulatory Visit: Payer: Self-pay

## 2017-04-15 ENCOUNTER — Encounter: Payer: Self-pay | Admitting: Emergency Medicine

## 2017-04-15 ENCOUNTER — Emergency Department
Admission: EM | Admit: 2017-04-15 | Discharge: 2017-04-16 | Disposition: A | Discharge status: Other Acute Inpatient Hospital | Payer: Medicaid Other | Attending: Emergency Medicine | Admitting: Emergency Medicine

## 2017-04-15 DIAGNOSIS — Z87891 Personal history of nicotine dependence: Secondary | ICD-10-CM | POA: Insufficient documentation

## 2017-04-15 DIAGNOSIS — Y939 Activity, unspecified: Secondary | ICD-10-CM | POA: Insufficient documentation

## 2017-04-15 DIAGNOSIS — Y929 Unspecified place or not applicable: Secondary | ICD-10-CM | POA: Insufficient documentation

## 2017-04-15 DIAGNOSIS — Z79899 Other long term (current) drug therapy: Secondary | ICD-10-CM | POA: Insufficient documentation

## 2017-04-15 DIAGNOSIS — X12XXXA Contact with other hot fluids, initial encounter: Secondary | ICD-10-CM | POA: Insufficient documentation

## 2017-04-15 DIAGNOSIS — T22011A Burn of unspecified degree of right forearm, initial encounter: Secondary | ICD-10-CM | POA: Insufficient documentation

## 2017-04-15 DIAGNOSIS — T310 Burns involving less than 10% of body surface: Secondary | ICD-10-CM

## 2017-04-15 DIAGNOSIS — Y999 Unspecified external cause status: Secondary | ICD-10-CM | POA: Diagnosis not present

## 2017-04-15 DIAGNOSIS — S59911A Unspecified injury of right forearm, initial encounter: Secondary | ICD-10-CM | POA: Diagnosis present

## 2017-04-15 MED ORDER — ONDANSETRON HCL 4 MG/2ML IJ SOLN
4.0000 mg | Freq: Once | INTRAMUSCULAR | Status: AC
  Administered 2017-04-16: 4 mg via INTRAVENOUS
  Filled 2017-04-15: qty 2

## 2017-04-15 MED ORDER — MORPHINE SULFATE (PF) 4 MG/ML IV SOLN
4.0000 mg | Freq: Once | INTRAVENOUS | Status: AC
  Administered 2017-04-16: 4 mg via INTRAVENOUS
  Filled 2017-04-15: qty 1

## 2017-04-15 MED ORDER — TETANUS-DIPHTH-ACELL PERTUSSIS 5-2.5-18.5 LF-MCG/0.5 IM SUSP
0.5000 mL | Freq: Once | INTRAMUSCULAR | Status: AC
  Administered 2017-04-16: 0.5 mL via INTRAMUSCULAR
  Filled 2017-04-15: qty 0.5
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
## 2017-04-16 LAB — CBC WITH DIFFERENTIAL/PLATELET
BAND NEUTROPHILS: 0 %
BASOS ABS: 0 10*3/uL (ref 0–0.1)
BLASTS: 0 %
Basophils Relative: 0 %
EOS ABS: 0.4 10*3/uL (ref 0–0.7)
Eosinophils Relative: 4 %
HCT: 33 % — ABNORMAL LOW (ref 35.0–47.0)
HEMOGLOBIN: 11 g/dL — AB (ref 12.0–16.0)
LYMPHS PCT: 36 %
Lymphs Abs: 3.3 10*3/uL (ref 1.0–3.6)
MCH: 30.5 pg (ref 26.0–34.0)
MCHC: 33.3 g/dL (ref 32.0–36.0)
MCV: 91.5 fL (ref 80.0–100.0)
METAMYELOCYTES PCT: 0 %
MONO ABS: 0.9 10*3/uL (ref 0.2–0.9)
MYELOCYTES: 0 %
Monocytes Relative: 10 %
Neutro Abs: 4.6 10*3/uL (ref 1.4–6.5)
Neutrophils Relative %: 50 %
OTHER: 0 %
PROMYELOCYTES ABS: 0 %
Platelets: 131 10*3/uL — ABNORMAL LOW (ref 150–440)
RBC: 3.6 MIL/uL — ABNORMAL LOW (ref 3.80–5.20)
RDW: 17.3 % — ABNORMAL HIGH (ref 11.5–14.5)
WBC: 9.2 10*3/uL (ref 3.6–11.0)
nRBC: 0 /100 WBC

## 2017-04-16 LAB — COMPREHENSIVE METABOLIC PANEL
ALBUMIN: 4 g/dL (ref 3.5–5.0)
ALK PHOS: 114 U/L (ref 38–126)
ALT: 38 U/L (ref 14–54)
ANION GAP: 11 (ref 5–15)
AST: 123 U/L — ABNORMAL HIGH (ref 15–41)
BILIRUBIN TOTAL: 1.6 mg/dL — AB (ref 0.3–1.2)
BUN: 11 mg/dL (ref 6–20)
CALCIUM: 9 mg/dL (ref 8.9–10.3)
CO2: 23 mmol/L (ref 22–32)
CREATININE: 1.14 mg/dL — AB (ref 0.44–1.00)
Chloride: 99 mmol/L — ABNORMAL LOW (ref 101–111)
GFR calc Af Amer: >60 mL/min (ref >60)
GFR calc non Af Amer: >60 mL/min (ref >60)
GLUCOSE: 92 mg/dL (ref 65–99)
Potassium: 3.1 mmol/L — ABNORMAL LOW (ref 3.5–5.1)
Sodium: 133 mmol/L — ABNORMAL LOW (ref 135–145)
TOTAL PROTEIN: 8.5 g/dL — AB (ref 6.5–8.1)

## 2017-04-16 NOTE — ED Notes (Signed)
EMTALA and Medical Necessity documentation reviewed at this time and noted to be complete per policy. 

## 2017-04-16 NOTE — ED Notes (Addendum)
Pt. Verbalizes understanding of admission [facility]. VS stable.  Pt. In NAD and Stable at the time of departure from the unit, departing unit by the safest and most appropriate manner per that pt condition and limitations with all belongings accounted for, [facility]. Pt signed consent to transfer
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
## 2018-01-23 ENCOUNTER — Inpatient Hospital Stay
Admission: EM | Admit: 2018-01-23 | Discharge: 2018-01-24 | DRG: 441 | Disposition: A | Discharge status: Other Acute Inpatient Hospital | Payer: Medicaid Other | Attending: Internal Medicine | Admitting: Internal Medicine

## 2018-01-23 ENCOUNTER — Encounter: Payer: Self-pay | Admitting: Emergency Medicine

## 2018-01-23 ENCOUNTER — Other Ambulatory Visit: Payer: Self-pay

## 2018-01-23 DIAGNOSIS — I615 Nontraumatic intracerebral hemorrhage, intraventricular: Secondary | ICD-10-CM | POA: Diagnosis present

## 2018-01-23 DIAGNOSIS — K7290 Hepatic failure, unspecified without coma: Principal | ICD-10-CM | POA: Diagnosis present

## 2018-01-23 DIAGNOSIS — E874 Mixed disorder of acid-base balance: Secondary | ICD-10-CM | POA: Diagnosis present

## 2018-01-23 DIAGNOSIS — I618 Other nontraumatic intracerebral hemorrhage: Secondary | ICD-10-CM | POA: Diagnosis present

## 2018-01-23 DIAGNOSIS — R402120 Coma scale, eyes open, to pain, unspecified time: Secondary | ICD-10-CM | POA: Diagnosis present

## 2018-01-23 DIAGNOSIS — Z79899 Other long term (current) drug therapy: Secondary | ICD-10-CM

## 2018-01-23 DIAGNOSIS — E876 Hypokalemia: Secondary | ICD-10-CM

## 2018-01-23 DIAGNOSIS — R140 Abdominal distension (gaseous): Secondary | ICD-10-CM

## 2018-01-23 DIAGNOSIS — Y906 Blood alcohol level of 120-199 mg/100 ml: Secondary | ICD-10-CM | POA: Diagnosis present

## 2018-01-23 DIAGNOSIS — J9600 Acute respiratory failure, unspecified whether with hypoxia or hypercapnia: Secondary | ICD-10-CM

## 2018-01-23 DIAGNOSIS — R402350 Coma scale, best motor response, localizes pain, unspecified time: Secondary | ICD-10-CM | POA: Diagnosis present

## 2018-01-23 DIAGNOSIS — K7682 Hepatic encephalopathy: Secondary | ICD-10-CM

## 2018-01-23 DIAGNOSIS — R7881 Bacteremia: Secondary | ICD-10-CM | POA: Diagnosis present

## 2018-01-23 DIAGNOSIS — Z789 Other specified health status: Secondary | ICD-10-CM

## 2018-01-23 DIAGNOSIS — F10931 Alcohol use, unspecified with withdrawal delirium: Secondary | ICD-10-CM

## 2018-01-23 DIAGNOSIS — R402230 Coma scale, best verbal response, inappropriate words, unspecified time: Secondary | ICD-10-CM | POA: Diagnosis present

## 2018-01-23 DIAGNOSIS — K7210 Chronic hepatic failure without coma: Secondary | ICD-10-CM

## 2018-01-23 DIAGNOSIS — R7989 Other specified abnormal findings of blood chemistry: Secondary | ICD-10-CM

## 2018-01-23 DIAGNOSIS — E872 Acidosis: Secondary | ICD-10-CM | POA: Diagnosis present

## 2018-01-23 DIAGNOSIS — R740 Nonspecific elevation of levels of transaminase and lactic acid dehydrogenase [LDH]: Secondary | ICD-10-CM | POA: Diagnosis present

## 2018-01-23 DIAGNOSIS — E7229 Other disorders of urea cycle metabolism: Secondary | ICD-10-CM | POA: Diagnosis present

## 2018-01-23 DIAGNOSIS — J9690 Respiratory failure, unspecified, unspecified whether with hypoxia or hypercapnia: Secondary | ICD-10-CM | POA: Diagnosis not present

## 2018-01-23 DIAGNOSIS — N179 Acute kidney failure, unspecified: Secondary | ICD-10-CM | POA: Diagnosis present

## 2018-01-23 DIAGNOSIS — N189 Chronic kidney disease, unspecified: Secondary | ICD-10-CM | POA: Diagnosis present

## 2018-01-23 DIAGNOSIS — K7031 Alcoholic cirrhosis of liver with ascites: Secondary | ICD-10-CM

## 2018-01-23 DIAGNOSIS — D689 Coagulation defect, unspecified: Secondary | ICD-10-CM | POA: Diagnosis present

## 2018-01-23 DIAGNOSIS — E8729 Other acidosis: Secondary | ICD-10-CM | POA: Diagnosis present

## 2018-01-23 DIAGNOSIS — B9689 Other specified bacterial agents as the cause of diseases classified elsewhere: Secondary | ICD-10-CM | POA: Diagnosis present

## 2018-01-23 DIAGNOSIS — G919 Hydrocephalus, unspecified: Secondary | ICD-10-CM | POA: Diagnosis present

## 2018-01-23 DIAGNOSIS — E8809 Other disorders of plasma-protein metabolism, not elsewhere classified: Secondary | ICD-10-CM | POA: Diagnosis present

## 2018-01-23 DIAGNOSIS — G312 Degeneration of nervous system due to alcohol: Secondary | ICD-10-CM | POA: Diagnosis present

## 2018-01-23 DIAGNOSIS — K219 Gastro-esophageal reflux disease without esophagitis: Secondary | ICD-10-CM | POA: Diagnosis present

## 2018-01-23 DIAGNOSIS — F17210 Nicotine dependence, cigarettes, uncomplicated: Secondary | ICD-10-CM | POA: Diagnosis present

## 2018-01-23 DIAGNOSIS — F10231 Alcohol dependence with withdrawal delirium: Secondary | ICD-10-CM

## 2018-01-23 DIAGNOSIS — D696 Thrombocytopenia, unspecified: Secondary | ICD-10-CM | POA: Diagnosis present

## 2018-01-23 HISTORY — DX: Alcohol dependence, uncomplicated: F10.20

## 2018-01-23 HISTORY — DX: Other ascites: R18.8

## 2018-01-23 LAB — URINALYSIS, COMPLETE (UACMP) WITH MICROSCOPIC
Bilirubin Urine: NEGATIVE
GLUCOSE, UA: NEGATIVE mg/dL
Ketones, ur: NEGATIVE mg/dL
Leukocytes, UA: NEGATIVE
Nitrite: NEGATIVE
Protein, ur: NEGATIVE mg/dL
Specific Gravity, Urine: 1.01 (ref 1.005–1.030)
pH: 5 (ref 5.0–8.0)

## 2018-01-23 LAB — COMPREHENSIVE METABOLIC PANEL
ALBUMIN: 2.4 g/dL — AB (ref 3.5–5.0)
ALK PHOS: 174 U/L — AB (ref 38–126)
ALT: 36 U/L (ref 0–44)
ANION GAP: 18 — AB (ref 5–15)
AST: 174 U/L — AB (ref 15–41)
BILIRUBIN TOTAL: 5.9 mg/dL — AB (ref 0.3–1.2)
BUN: <5 mg/dL — ABNORMAL LOW (ref 6–20)
CALCIUM: 8.3 mg/dL — AB (ref 8.9–10.3)
CO2: 11 mmol/L — ABNORMAL LOW (ref 22–32)
Chloride: 108 mmol/L (ref 98–111)
Creatinine, Ser: 1.58 mg/dL — ABNORMAL HIGH (ref 0.44–1.00)
GFR calc Af Amer: 49 mL/min — ABNORMAL LOW (ref >60)
GFR, EST NON AFRICAN AMERICAN: 42 mL/min — AB (ref >60)
GLUCOSE: 104 mg/dL — AB (ref 70–99)
POTASSIUM: 2.7 mmol/L — AB (ref 3.5–5.1)
Sodium: 137 mmol/L (ref 135–145)
TOTAL PROTEIN: 7.9 g/dL (ref 6.5–8.1)

## 2018-01-23 LAB — LIPASE, BLOOD: Lipase: 48 U/L (ref 11–51)

## 2018-01-23 LAB — CBC
HCT: 26 % — ABNORMAL LOW (ref 36.0–46.0)
Hemoglobin: 9.5 g/dL — ABNORMAL LOW (ref 12.0–15.0)
MCH: 32.5 pg (ref 26.0–34.0)
MCHC: 36.5 g/dL — ABNORMAL HIGH (ref 30.0–36.0)
MCV: 89 fL (ref 80.0–100.0)
Platelets: 53 10*3/uL — ABNORMAL LOW (ref 150–400)
RBC: 2.92 MIL/uL — ABNORMAL LOW (ref 3.87–5.11)
RDW: 20.5 % — ABNORMAL HIGH (ref 11.5–15.5)
WBC: 5.1 10*3/uL (ref 4.0–10.5)
nRBC: 0.6 % — ABNORMAL HIGH (ref 0.0–0.2)

## 2018-01-23 LAB — POCT PREGNANCY, URINE: Preg Test, Ur: NEGATIVE

## 2018-01-23 LAB — AMMONIA: AMMONIA: 36 umol/L — AB (ref 9–35)

## 2018-01-23 LAB — TROPONIN I: Troponin I: <0.03 ng/mL (ref <0.03)

## 2018-01-23 LAB — LACTIC ACID, PLASMA: Lactic Acid, Venous: 9.7 mmol/L (ref 0.5–1.9)

## 2018-01-23 LAB — ETHANOL: Alcohol, Ethyl (B): 160 mg/dL — ABNORMAL HIGH (ref <10)

## 2018-01-23 MED ORDER — SODIUM CHLORIDE 0.9 % IV BOLUS
1000.0000 mL | Freq: Once | INTRAVENOUS | Status: AC
  Administered 2018-01-23: 1000 mL via INTRAVENOUS

## 2018-01-23 MED ORDER — THIAMINE HCL 100 MG/ML IJ SOLN
100.0000 mg | Freq: Once | INTRAMUSCULAR | Status: AC
  Administered 2018-01-23: 100 mg via INTRAVENOUS
  Filled 2018-01-23: qty 2

## 2018-01-23 MED ORDER — LORAZEPAM 2 MG/ML IJ SOLN
2.0000 mg | Freq: Once | INTRAMUSCULAR | Status: AC
  Administered 2018-01-23: 2 mg via INTRAVENOUS
  Filled 2018-01-23: qty 1

## 2018-01-23 MED ORDER — LORAZEPAM 2 MG/ML IJ SOLN: 1.0000 mg | Freq: Once | INTRAMUSCULAR | Status: DC

## 2018-01-23 MED ORDER — SODIUM CHLORIDE 0.9 % IV BOLUS
500.0000 mL | Freq: Once | INTRAVENOUS | Status: DC
  Administered 2018-01-24: 500 mL via INTRAVENOUS

## 2018-01-23 MED ORDER — FOLIC ACID 1 MG PO TABS: 1.0000 mg | ORAL_TABLET | Freq: Every day | ORAL | Status: DC

## 2018-01-23 MED ORDER — ADULT MULTIVITAMIN W/MINERALS CH
1.0000 | ORAL_TABLET | Freq: Once | ORAL | Status: DC
  Filled 2018-01-23: qty 1

## 2018-01-23 MED ORDER — DEXTROSE-NACL 5-0.9 % IV SOLN
INTRAVENOUS | Status: DC
  Administered 2018-01-24: 03:00:00 via INTRAVENOUS
  Filled 2018-01-23: qty 1000

## 2018-01-23 MED ORDER — POTASSIUM CHLORIDE CRYS ER 20 MEQ PO TBCR
40.0000 meq | EXTENDED_RELEASE_TABLET | Freq: Once | ORAL | Status: DC
  Filled 2018-01-23: qty 2

## 2018-01-23 MED ORDER — POTASSIUM CHLORIDE 10 MEQ/100ML IV SOLN
10.0000 meq | Freq: Once | INTRAVENOUS | Status: AC
  Administered 2018-01-24: 10 meq via INTRAVENOUS
  Filled 2018-01-23: qty 100
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
## 2018-01-23 NOTE — ED Triage Notes (Addendum)
Pt reports trouble voiding for 2 days; feels the need to void but only goes small amounts; also feeling constipated and bloated; says sometimes she feels like she's having trouble catching her breath due to the bloating; passing gas; nausea and vomiting-twice with the last time being yesterday; pt awake and alert; talking in complete coherent sentences; pt adds history of Cirrhosis; has had ascites before but never enough fluid to remove; also adds a headache for a week

## 2018-01-23 NOTE — ED Provider Notes (Addendum)
[facility] Emergency Department Provider Note  ____________________________________________   First MD Initiated Contact with Patient 01/23/18 2304     (approximate)  I have reviewed the triage vital signs and the nursing notes.   HISTORY  Chief Complaint Abdominal Pain; Constipation; and Shortness of Breath  Level 5 caveat:  history/ROS limited by apparent altered mental status/confusion   HPI Janet Weiss is a 32 y.o. female with medical history that includes chronic alcohol use and cirrhosis with ascites.  She presents by private vehicle tonight for evaluation of abdominal swelling but also with apparent confusion.  Her history is vague.  She reports that her abdomen is swollen and this is been a gradually worsening process over an extended period of time.  She says that she goes to a a doctor in [place] for her cirrhosis and that doctor has prescribed a couple medicines but she stopped taking them because 1 of them started smelling bad and she thinks that somebody put something in it.  She says that she has a history of seizures but does not know why and has not had one for about a month.  She does not think she takes any medicine for it.  She says that she does not see things that she knows are not there, but the triage nurse informed me that the patient saw 2 people that were not present in the triage room and had some sort of interaction with him.  The patient reports generalized pain throughout her abdomen due to the swelling.  It is dull and aching and relatively mild but the swelling itself is severe.  She reports that she also has swelling in her lower extremities which is better when she wears compression stockings.  She denies fever/chills, chest pain, shortness of breath.  She has no dysuria.  She reports having occasional nausea and vomiting and vomited once earlier today.  She says that she last drank 24 ounces of wine earlier today but says  that her alcohol dependence is not severe.  Past Medical History:  Diagnosis Date  . Alcohol dependence (HCC)   . Ascites   . Cirrhosis (HCC)   . GERD (gastroesophageal reflux disease)     Patient Active Problem List   Diagnosis Date Noted  . Alcoholic ketoacidosis 02/14/18    Past Surgical History:  Procedure Laterality Date  . ESOPHAGOGASTRODUODENOSCOPY N/A 06/28/2014   Procedure: ESOPHAGOGASTRODUODENOSCOPY (EGD);  Surgeon: Wallace Cullens, MD;  Location[facility] ENDOSCOPY;  Service: Gastroenterology;  Laterality: N/A;    Prior to Admission medications   Medication Sig Start Date End Date Taking? Authorizing Provider  albuterol (PROVENTIL HFA;VENTOLIN HFA) 108 (90 Base) MCG/ACT inhaler Inhale 2 puffs into the lungs every 6 (six) hours as needed for wheezing or shortness of breath. 03/18/16  Yes Menshew, Charlesetta Ivory, PA-C  benzonatate (TESSALON PERLES) 100 MG capsule Take 1 capsule (100 mg total) by mouth 3 (three) times daily as needed for cough (Take 1-2 per dose). 03/18/16  Yes Menshew, Charlesetta Ivory, PA-C  busPIRone (BUSPAR) 7.5 MG tablet Take 7.5 mg by mouth 2 (two) times daily as needed (anxiety).    Yes [provider]  dexlansoprazole (DEXILANT) 60 MG capsule Take 60 mg by mouth daily.   Yes [provider]  dibucaine (NUPERCAINAL) 1 % OINT Place 1 application rectally 3 (three) times daily as needed for pain. 04/17/15  Yes Menshew, Charlesetta Ivory, PA-C  fluticasone (FLONASE) 50 MCG/ACT nasal spray Place 2 sprays  into both nostrils daily. 03/18/16  Yes Menshew, Charlesetta Ivory, PA-C  furosemide (LASIX) 40 MG tablet Take 40 mg by mouth daily.    Yes [provider]  Boris Lown Oil 300 MG CAPS Take 350 mg by mouth daily.    Yes [provider]  lactulose (CHRONULAC) 10 GM/15ML solution Take 30 mLs by mouth 3 (three) times daily. 08/05/15  Yes [provider]  nadolol (CORGARD) 20 MG tablet Take 20 mg by mouth daily. 08/05/15 01/23/18 Yes [provider]  ondansetron (ZOFRAN ODT) 4 MG disintegrating tablet Take 1 tablet (4 mg total) by mouth every 8 (eight) hours as needed for nausea or vomiting. 03/18/16  Yes Menshew, Charlesetta Ivory, PA-C  pantoprazole (PROTONIX) 40 MG tablet Take 40 mg by mouth 2 (two) times daily. 08/05/15  Yes [provider]  potassium chloride (K-DUR) 10 MEQ tablet Take 10 mEq by mouth daily. 08/05/15 01/23/18 Yes [provider]  Prenatal Multivit-Min-Fe-FA (PRE-NATAL PO) Take 1 tablet by mouth daily.   Yes [provider]  ranitidine (ZANTAC) 300 MG tablet Take 300 mg by mouth at bedtime. 08/05/15 01/23/18 Yes [provider]  sertraline (ZOLOFT) 25 MG tablet Take 25 mg by mouth 2 (two) times daily.    Yes [provider]  spironolactone (ALDACTONE) 50 MG tablet Take 50 mg by mouth daily.   Yes [provider]  sulfamethoxazole-trimethoprim (BACTRIM DS,SEPTRA DS) 800-160 MG per tablet Take 1 tablet by mouth 2 (two) times daily as needed (cyists). X 3 days   Yes [provider]  traMADol (ULTRAM) 50 MG tablet Take 50 mg by mouth every 6 (six) hours as needed. 08/23/15  Yes [provider]  valACYclovir (VALTREX) 1000 MG tablet Take 1,000 mg by mouth daily.    Yes [provider]    Allergies Patient has no known allergies.  History reviewed. No pertinent family history.  Social History Social History   Tobacco Use  . Smoking status: Current Every Day Smoker    Packs/day: 0.50    Types: Cigarettes  . Smokeless tobacco: Never Used  Substance Use Topics  . Alcohol use: Yes    Comment: 24 oz daily  . Drug use: Never    Review of Systems Level 5 caveat:  history/ROS limited by apparent altered mental status/confusion  Constitutional: No fever/chills Eyes: No visual changes. ENT: No sore throat. Cardiovascular: Denies chest pain. Respiratory: Denies shortness of breath. Gastrointestinal: Abdominal distention and  generalized abdominal pain throughout.  Occasional nausea and vomiting. Genitourinary: Negative for dysuria. Musculoskeletal: Negative for neck pain.  Negative for back pain. Integumentary: Negative for rash. Neurological: Negative for headaches, focal weakness or numbness. Psychiatric:Patient has a flat affect and is a vague historian.  She denies visual and auditory hallucinations but according to the triage nurse was interacting with 2 people that were not actually present when she was in the triage room.  ____________________________________________   PHYSICAL EXAM:  VITAL SIGNS: ED Triage Vitals  Enc Vitals Group     BP 01/23/18 2208 134/82     Pulse Rate 01/23/18 2208 (!) 131     Resp 01/23/18 2208 18     Temp 01/23/18 2208 99.3 F (37.4 C)     Temp Source 01/23/18 2208 Oral     SpO2 01/23/18 2208 98 %     Weight 01/23/18 2211 59.9 kg (132 lb)     Height 01/23/18 2211 1.575 m (5\' 2" )     Head Circumference --  Peak Flow --      Pain Score 01/23/18 2211 10     Pain Loc --      Pain Edu? --      Excl. in GC? --     Constitutional: Alert and oriented. Well appearing and in no acute distress. Eyes: Severe scleral icterus.  Pupils are equal and reactive bilaterally. Head: Atraumatic. Nose: No congestion/rhinnorhea. Mouth/Throat: Mucous membranes are moist. Neck: No stridor.  No meningeal signs.   Cardiovascular: Tachycardia with a rate in the 130s., regular rhythm. Good peripheral circulation. Grossly normal heart sounds. Respiratory: Normal respiratory effort.  No retractions. Lungs CTAB. Gastrointestinal: Soft and nontender. No distention.  Musculoskeletal: No lower extremity tenderness nor edema. No gross deformities of extremities. Neurologic:  Normal speech and language. No gross focal neurologic deficits are appreciated.  Skin:  Skin is warm, dry and intact. No rash noted. Psychiatric: Mood and affect are flat and blunted.  Apparent visual hallucinations and  possible auditory hallucinations.  Poor insight into her disease process.  No SI nor HI.  ____________________________________________   LABS (all labs ordered are listed, but only abnormal results are displayed)  Labs Reviewed  COMPREHENSIVE METABOLIC PANEL - Abnormal; Notable for the following components:      Result Value   Potassium 2.7 (*)    CO2 11 (*)    Glucose, Bld 104 (*)    BUN <5 (*)    Creatinine, Ser 1.58 (*)    Calcium 8.3 (*)    Albumin 2.4 (*)    AST 174 (*)    Alkaline Phosphatase 174 (*)    Total Bilirubin 5.9 (*)    GFR calc non Af Amer 42 (*)    GFR calc Af Amer 49 (*)    Anion gap 18 (*)    All other components within normal limits  CBC - Abnormal; Notable for the following components:   RBC 2.92 (*)    Hemoglobin 9.5 (*)    HCT 26.0 (*)    MCHC 36.5 (*)    RDW 20.5 (*)    Platelets 53 (*)    nRBC 0.6 (*)    All other components within normal limits  URINALYSIS, COMPLETE (UACMP) WITH MICROSCOPIC - Abnormal; Notable for the following components:   Color, Urine YELLOW (*)    APPearance CLOUDY (*)    Hgb urine dipstick SMALL (*)    Bacteria, UA RARE (*)    All other components within normal limits  AMMONIA - Abnormal; Notable for the following components:   Ammonia 36 (*)    All other components within normal limits  LACTIC ACID, PLASMA - Abnormal; Notable for the following components:   Lactic Acid, Venous 9.7 (*)    All other components within normal limits  LACTIC ACID, PLASMA - Abnormal; Notable for the following components:   Lactic Acid, Venous 12.8 (*)    All other components within normal limits  ETHANOL - Abnormal; Notable for the following components:   Alcohol, Ethyl (B) 160 (*)    All other components within normal limits  BETA-HYDROXYBUTYRIC ACID - Abnormal; Notable for the following components:   Beta-Hydroxybutyric Acid 0.34 (*)    All other components within normal limits  GLUCOSE, CAPILLARY - Abnormal; Notable for the following  components:   Glucose-Capillary 104 (*)    All other components within normal limits  LIPASE, BLOOD  TROPONIN I  HEPATITIS PANEL, ACUTE  MAGNESIUM  POC URINE PREG, ED  POCT PREGNANCY, URINE   ____________________________________________  EKG  ED ECG REPORT I, Lynett Brasil, the attending physician, personally viewed and interpreted this ECG.  Date: 01/23/2018 EKG Time: 22: 31 Rate: 129 Rhythm: Sinus tachycardia QRS Axis: normal Intervals: normal ST/T Wave abnormalities: Non-specific ST segment / T-wave changes, but no evidence of acute ischemia. Narrative Interpretation: no evidence of acute ischemia   ____________________________________________  RADIOLOGY   ED MD interpretation:  No imaging ordered  Official radiology report(s): Dg Chest Portable 1 View  Result Date: 01/09/2018 CLINICAL DATA:  Altered mental status tachycardia EXAM: PORTABLE CHEST 1 VIEW COMPARISON:  None. FINDINGS: Normal heart size. Normal mediastinal contour. No pneumothorax. No pleural effusion. Low lung volumes. No pulmonary edema. No acute consolidative airspace disease. IMPRESSION: Low lung volumes with no active cardiopulmonary disease. Electronically Signed   By: Jason A Poff M.D.   On: 01/07/2018 02:58    ____________________________________________   PROCEDURES  Critical Care performed: Yes, see critical care procedure note(s)   Procedure(s) performed:   .Critical Care Performed by: Nekeisha Aure, MD Authorized by: Trayvond Viets, MD   Critical care provider statement:    Critical care time (minutes):  60   Critical care time was exclusive of:  Separately billable procedures and treating other patients   Critical care was necessary to treat or prevent imminent or life-threatening deterioration of the following conditions:  Hepatic failure and CNS failure or compromise   Critical care was time spent personally by me on the following activities:  Development of treatment plan with  patient or surrogate, discussions with consultants, evaluation of patient's response to treatment, examination of patient, obtaining history from patient or surrogate, ordering and performing treatments and interventions, ordering and review of laboratory studies, ordering and review of radiographic studies, pulse oximetry, re-evaluation of patient's condition and review of old charts  Procedure Name: Intubation Date/Time: 01/02/2018 3:42 AM Performed by: Kodie Kishi, MD Pre-anesthesia Checklist: Patient identified, Emergency Drugs available, Suction available and Patient being monitored Preoxygenation: Pre-oxygenation with 100% oxygen Induction Type: IV induction and Rapid sequence Laryngoscope Size: Glidescope Tube size: 7.0 mm Number of attempts: 1 Placement Confirmation: ETT inserted through vocal cords under direct vision,  CO2 detector and Breath sounds checked- equal and bilateral Dental Injury: Teeth and Oropharynx as per pre-operative assessment         ____________________________________________   INITIAL IMPRESSION / ASSESSMENT AND PLAN / ED COURSE  As part of my medical decision making, I reviewed the following data within the electronic MEDICAL RECORD NUMBER Nursing notes reviewed and incorporated, Labs reviewed , EKG interpreted , Old chart reviewed, Discussed with admitting physician  and Notes from prior ED visits    Differential diagnosis includes, but is not limited to, hepatic encephalopathy secondary to liver failure, spontaneous bacterial peritonitis, alcohol withdrawal, delirium tremens, Wernicke's encephalopathy, other acute intra-abdominal infection such as appendicitis or diverticulitis.  She has minimal tenderness to palpation throughout the abdomen and I think that her pain/tenderness is most likely the result of her substantial ascites.  She is tachycardic in the 130s and confused and hallucinating, likely either suggestive of delirium tremens or hepatic  encephalopathy.  She has obvious scleral icterus.  In spite of all that, she is ambulating without difficulty and wanting to eat and smoke.  She is not in acute distress.  Broad laboratory work-up is pending.  I will provide <MEASUREMEEdwena Felty given the tachycardia but also provide Ativan 1 mg IV for treatment of possible alcohol withdrawal/DTs.  Keeping the patient n.p.o. for now.  She is afebrile  and I do not think she is suffering from an infectious process.  Given the current presentation, I think SBP is unlikely and at this point the risks of a diagnostic paracentesis are greater than the benefit, but I will continue to reassess.  Of note, I reviewed the medical record and saw multiple notes from a Dr. Kriste Basque with gastroenterology at [facility].  The patient called Dr. Selena Batten 3 days ago about the abdominal pain and distention and Dr. Selena Batten reiterated that the patient should be taking her lactulose but the patient states that she has not been taking it.  She also has a history of melena which is well documented.  She is supposed to be taking furosemide and spironolactone, but based on the patient's own history, it does not sound as if she has been taking them, although she has no signs of peripheral edema and no respiratory issues; her edema seems isolated to her ascites.   Clinical Course as of Jan 25 344  Wynelle Link Jan 23, 2018  2319 Hemoglobin is 9.5, CBC otherwise unremarkable.  CBC(!) [CF]  2342 Potassium(!!): 2.7 [CF]  2342 Total Bilirubin(!): 5.9 [CF]  2342 Alcohol, Ethyl (B)(!): 160 [CF]  2342 Unremarkable urinalysis  Urinalysis, Complete w Microscopic(!) [CF]  2343 Minimally elevated ammonia  Ammonia(!): 36 [CF]  2345 The patient is noted to have a lactate>4. With the current information available to me, I don't think the patient is in septic shock. The lactate>4, is related to liver failure    Lactic Acid, Venous(!!): 9.7 [CF]  2346 Creatinine(!): 1.58 [CF]  2346 Anion gap(!): 18 [CF]  2355 The  patient's   [CF]  2356 No evidence of acute infection.  No leukocytosis and afebrile.  I suspect this is the result of her decompensated cirrhosis.  I reviewed the lab results from [facility] and her bilirubin elevation was not present 6 months ago.  I have ordered D5 normal saline infusion along with her liter bolus for the possible alcoholic ketoacidosis and added on a beta hydroxybutyric acid.  Have also added on acute hepatitis panel for the sake of completion.  She received thiamine 100 mg IV for possible Wernicke's encephalopathy. CIWA protocol has been ordered.   Discussed case with Dr. Anne Hahn with the hospitalist service who will admit.   [CF]  Mon Jan 24, 2018  0211 Patient awaiting admission.  Heart rate is now up in the 150s and she is becoming increasingly agitated.  Providing another Ativan 2 mg IV.  Beta hydroxybutyric acid is slightly elevated.  We are awaiting D5 normal saline from pharmacy.   [CF]  0238 Still no D5-NS.  Called pharmacy personally and asked them to send a bag, and they said they would do so.   [CF]  0340 Patient continued to decompensate.  Her second lactic acid was up to 12.8.  She is continuing to get IV fluids.  She became less responsive while her respiratory rate and her heart rate were going up.  After discussion with Dr. Sheryle Hail the hospitalist I agreed that intubating in the emergency department in a controlled setting would be preferable to having her decompensate when she goes upstairs and there is no provider immediately available.  I intubated her as described above.  Of note, just before intubation, before RSI meds were administered, it appeared that she was having some seizure-like activity although she was still responding to pain.  I have ordered Versed 5 mg/h and fentanyl 100 mcg/h post intubation.   [CF]  1610 Of note, the hospitalist and I discussed with the respiratory therapist the need for the patient to have a high ventilatory rate given her acidosis.   We agreed upon a tidal volume of 420 mL and a respiratory rate of 30 given that her respiratory rate was about 30-35 prior to intubation.   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9 % 50 mL (1 mg/mL) infusion (5 mg/hr Intravenous New Bag/Given 2018/02/22 0340)  fentaNYL in NS (2mcg/ml) infusion-PREMIX (has no administration in time range)  thiamine (B-1) injection 100 mg (100 mg Intravenous Given 01/23/18 2347)  LORazepam (ATIVAN) injection 2 mg (2 mg Intravenous Given 01/23/18 2347)  sodium chloride 0.9 % bolus 1,000 mL (0 mLs Intravenous Stopped 02-22-18 0229)  potassium chloride 10 mEq in 100 mL IVPB (0 mEq Intravenous Stopped 22-Feb-2018 0334)  LORazepam (ATIVAN) injection 2 mg (2 mg Intravenous Given 22-Feb-2018 0213)  etomidate (AMIDATE) injection 18 mg (18 mg Intravenous Given Feb 22, 2018 0335)  succinylcholine (ANECTINE) injection 90 mg (90 mg Intravenous Given 22-Feb-2018 0336)     ED Discharge Orders    None       Note:  This document was prepared using Dragon voice recognition software and may include unintentional dictation errors.      Loleta Rose, MD 2018-02-22 [id number]    Loleta Rose, MD 2018/02/22 [phone]

## 2018-01-24 ENCOUNTER — Inpatient Hospital Stay: Payer: Medicaid Other

## 2018-01-24 ENCOUNTER — Inpatient Hospital Stay (HOSPITAL_COMMUNITY)
Admission: AD | Admit: 2018-01-24 | Discharge: 2018-02-26 | DRG: 023 | Disposition: E | Payer: Medicaid Other | Source: Other Acute Inpatient Hospital | Attending: Neurology | Admitting: Neurology

## 2018-01-24 DIAGNOSIS — D6959 Other secondary thrombocytopenia: Secondary | ICD-10-CM | POA: Diagnosis present

## 2018-01-24 DIAGNOSIS — N179 Acute kidney failure, unspecified: Secondary | ICD-10-CM

## 2018-01-24 DIAGNOSIS — Z978 Presence of other specified devices: Secondary | ICD-10-CM | POA: Diagnosis not present

## 2018-01-24 DIAGNOSIS — D684 Acquired coagulation factor deficiency: Secondary | ICD-10-CM | POA: Diagnosis not present

## 2018-01-24 DIAGNOSIS — A4151 Sepsis due to Escherichia coli [E. coli]: Secondary | ICD-10-CM | POA: Diagnosis present

## 2018-01-24 DIAGNOSIS — D649 Anemia, unspecified: Secondary | ICD-10-CM | POA: Diagnosis present

## 2018-01-24 DIAGNOSIS — F10229 Alcohol dependence with intoxication, unspecified: Secondary | ICD-10-CM | POA: Diagnosis present

## 2018-01-24 DIAGNOSIS — Z9289 Personal history of other medical treatment: Secondary | ICD-10-CM

## 2018-01-24 DIAGNOSIS — J9601 Acute respiratory failure with hypoxia: Secondary | ICD-10-CM

## 2018-01-24 DIAGNOSIS — Z515 Encounter for palliative care: Secondary | ICD-10-CM | POA: Diagnosis not present

## 2018-01-24 DIAGNOSIS — E162 Hypoglycemia, unspecified: Secondary | ICD-10-CM | POA: Diagnosis not present

## 2018-01-24 DIAGNOSIS — R578 Other shock: Secondary | ICD-10-CM | POA: Diagnosis present

## 2018-01-24 DIAGNOSIS — G919 Hydrocephalus, unspecified: Secondary | ICD-10-CM | POA: Diagnosis present

## 2018-01-24 DIAGNOSIS — G9341 Metabolic encephalopathy: Secondary | ICD-10-CM | POA: Diagnosis not present

## 2018-01-24 DIAGNOSIS — I610 Nontraumatic intracerebral hemorrhage in hemisphere, subcortical: Secondary | ICD-10-CM | POA: Diagnosis not present

## 2018-01-24 DIAGNOSIS — R7881 Bacteremia: Secondary | ICD-10-CM | POA: Diagnosis present

## 2018-01-24 DIAGNOSIS — E872 Acidosis, unspecified: Secondary | ICD-10-CM

## 2018-01-24 DIAGNOSIS — D696 Thrombocytopenia, unspecified: Secondary | ICD-10-CM | POA: Diagnosis present

## 2018-01-24 DIAGNOSIS — K219 Gastro-esophageal reflux disease without esophagitis: Secondary | ICD-10-CM | POA: Diagnosis present

## 2018-01-24 DIAGNOSIS — G936 Cerebral edema: Secondary | ICD-10-CM | POA: Diagnosis not present

## 2018-01-24 DIAGNOSIS — E874 Mixed disorder of acid-base balance: Secondary | ICD-10-CM | POA: Diagnosis present

## 2018-01-24 DIAGNOSIS — E43 Unspecified severe protein-calorie malnutrition: Secondary | ICD-10-CM | POA: Diagnosis not present

## 2018-01-24 DIAGNOSIS — Y906 Blood alcohol level of 120-199 mg/100 ml: Secondary | ICD-10-CM | POA: Diagnosis present

## 2018-01-24 DIAGNOSIS — Z6823 Body mass index (BMI) 23.0-23.9, adult: Secondary | ICD-10-CM

## 2018-01-24 DIAGNOSIS — K7290 Hepatic failure, unspecified without coma: Secondary | ICD-10-CM | POA: Diagnosis not present

## 2018-01-24 DIAGNOSIS — K7031 Alcoholic cirrhosis of liver with ascites: Secondary | ICD-10-CM | POA: Diagnosis present

## 2018-01-24 DIAGNOSIS — I615 Nontraumatic intracerebral hemorrhage, intraventricular: Secondary | ICD-10-CM | POA: Diagnosis present

## 2018-01-24 DIAGNOSIS — R739 Hyperglycemia, unspecified: Secondary | ICD-10-CM | POA: Diagnosis not present

## 2018-01-24 DIAGNOSIS — R0689 Other abnormalities of breathing: Secondary | ICD-10-CM | POA: Diagnosis not present

## 2018-01-24 DIAGNOSIS — I618 Other nontraumatic intracerebral hemorrhage: Secondary | ICD-10-CM | POA: Diagnosis present

## 2018-01-24 DIAGNOSIS — R402120 Coma scale, eyes open, to pain, unspecified time: Secondary | ICD-10-CM | POA: Diagnosis present

## 2018-01-24 DIAGNOSIS — R140 Abdominal distension (gaseous): Secondary | ICD-10-CM | POA: Diagnosis not present

## 2018-01-24 DIAGNOSIS — K7040 Alcoholic hepatic failure without coma: Secondary | ICD-10-CM | POA: Diagnosis not present

## 2018-01-24 DIAGNOSIS — R410 Disorientation, unspecified: Secondary | ICD-10-CM | POA: Diagnosis present

## 2018-01-24 DIAGNOSIS — D689 Coagulation defect, unspecified: Secondary | ICD-10-CM | POA: Diagnosis present

## 2018-01-24 DIAGNOSIS — R6521 Severe sepsis with septic shock: Secondary | ICD-10-CM | POA: Diagnosis present

## 2018-01-24 DIAGNOSIS — E8809 Other disorders of plasma-protein metabolism, not elsewhere classified: Secondary | ICD-10-CM | POA: Diagnosis present

## 2018-01-24 DIAGNOSIS — I619 Nontraumatic intracerebral hemorrhage, unspecified: Secondary | ICD-10-CM

## 2018-01-24 DIAGNOSIS — R40242 Glasgow coma scale score 9-12, unspecified time: Secondary | ICD-10-CM | POA: Diagnosis not present

## 2018-01-24 DIAGNOSIS — Z66 Do not resuscitate: Secondary | ICD-10-CM | POA: Diagnosis present

## 2018-01-24 DIAGNOSIS — F10231 Alcohol dependence with withdrawal delirium: Secondary | ICD-10-CM | POA: Diagnosis present

## 2018-01-24 DIAGNOSIS — N189 Chronic kidney disease, unspecified: Secondary | ICD-10-CM | POA: Diagnosis present

## 2018-01-24 DIAGNOSIS — G911 Obstructive hydrocephalus: Secondary | ICD-10-CM | POA: Diagnosis present

## 2018-01-24 DIAGNOSIS — E8729 Other acidosis: Secondary | ICD-10-CM | POA: Diagnosis present

## 2018-01-24 DIAGNOSIS — B9689 Other specified bacterial agents as the cause of diseases classified elsewhere: Secondary | ICD-10-CM | POA: Diagnosis present

## 2018-01-24 DIAGNOSIS — R195 Other fecal abnormalities: Secondary | ICD-10-CM | POA: Diagnosis present

## 2018-01-24 DIAGNOSIS — J9690 Respiratory failure, unspecified, unspecified whether with hypoxia or hypercapnia: Secondary | ICD-10-CM | POA: Diagnosis not present

## 2018-01-24 DIAGNOSIS — E7229 Other disorders of urea cycle metabolism: Secondary | ICD-10-CM | POA: Diagnosis present

## 2018-01-24 DIAGNOSIS — E876 Hypokalemia: Secondary | ICD-10-CM | POA: Diagnosis present

## 2018-01-24 DIAGNOSIS — G312 Degeneration of nervous system due to alcohol: Secondary | ICD-10-CM | POA: Diagnosis present

## 2018-01-24 DIAGNOSIS — R740 Nonspecific elevation of levels of transaminase and lactic acid dehydrogenase [LDH]: Secondary | ICD-10-CM | POA: Diagnosis present

## 2018-01-24 DIAGNOSIS — F17210 Nicotine dependence, cigarettes, uncomplicated: Secondary | ICD-10-CM | POA: Diagnosis present

## 2018-01-24 LAB — COMPREHENSIVE METABOLIC PANEL
ALT: 30 U/L (ref 0–44)
AST: 152 U/L — ABNORMAL HIGH (ref 15–41)
Albumin: 2 g/dL — ABNORMAL LOW (ref 3.5–5.0)
Alkaline Phosphatase: 130 U/L — ABNORMAL HIGH (ref 38–126)
Anion gap: 19 — ABNORMAL HIGH (ref 5–15)
BILIRUBIN TOTAL: 5.8 mg/dL — AB (ref 0.3–1.2)
BUN: <5 mg/dL — ABNORMAL LOW (ref 6–20)
CO2: 8 mmol/L — ABNORMAL LOW (ref 22–32)
Calcium: 7.5 mg/dL — ABNORMAL LOW (ref 8.9–10.3)
Chloride: 109 mmol/L (ref 98–111)
Creatinine, Ser: 1.32 mg/dL — ABNORMAL HIGH (ref 0.44–1.00)
GFR calc Af Amer: >60 mL/min (ref >60)
GFR calc non Af Amer: 53 mL/min — ABNORMAL LOW (ref >60)
GLUCOSE: 109 mg/dL — AB (ref 70–99)
Potassium: 3 mmol/L — ABNORMAL LOW (ref 3.5–5.1)
Sodium: 136 mmol/L (ref 135–145)
TOTAL PROTEIN: 6.8 g/dL (ref 6.5–8.1)

## 2018-01-24 LAB — BLOOD GAS, ARTERIAL
Acid-base deficit: 19.8 mmol/L — ABNORMAL HIGH (ref 0.0–2.0)
Acid-base deficit: 18.8 mmol/L — ABNORMAL HIGH (ref 0.0–2.0)
Acid-base deficit: 4.9 mmol/L — ABNORMAL HIGH (ref 0.0–2.0)
Bicarbonate: 6.3 mmol/L — ABNORMAL LOW (ref 20.0–28.0)
Bicarbonate: 7.5 mmol/L — ABNORMAL LOW (ref 20.0–28.0)
Bicarbonate: 17.7 mmol/L — ABNORMAL LOW (ref 20.0–28.0)
FIO2: 35
FIO2: 0.35
FIO2: 0.35
LHR: 26 {breaths}/min
MECHVT: 420 mL
MECHVT: 420 mL
MECHVT: 420 mL
Mechanical Rate: 30
Mechanical Rate: 30
O2 SAT: 99.4 %
O2 Saturation: 99.1 %
O2 Saturation: 98.9 %
PATIENT TEMPERATURE: 37
PATIENT TEMPERATURE: 37
PCO2 ART: 20 mmHg — AB (ref 32.0–48.0)
PEEP/CPAP: 5 cmH2O
PEEP: 5 cmH2O
PEEP: 5 cmH2O
PO2 ART: 124 mmHg — AB (ref 83.0–108.0)
Patient temperature: 37
RATE: 30 resp/min
pCO2 arterial: <19 mmHg — CL (ref 32.0–48.0)
pCO2 arterial: 26 mmHg — ABNORMAL LOW (ref 32.0–48.0)
pH, Arterial: 7.18 — CL (ref 7.350–7.450)
pH, Arterial: 7.18 — CL (ref 7.350–7.450)
pH, Arterial: 7.44 (ref 7.350–7.450)
pO2, Arterial: 187 mmHg — ABNORMAL HIGH (ref 83.0–108.0)
pO2, Arterial: 166 mmHg — ABNORMAL HIGH (ref 83.0–108.0)

## 2018-01-24 LAB — GLUCOSE, CAPILLARY
Glucose-Capillary: 104 mg/dL — ABNORMAL HIGH (ref 70–99)
Glucose-Capillary: 103 mg/dL — ABNORMAL HIGH (ref 70–99)
Glucose-Capillary: 140 mg/dL — ABNORMAL HIGH (ref 70–99)

## 2018-01-24 LAB — BASIC METABOLIC PANEL
Anion gap: 13 (ref 5–15)
BUN: <5 mg/dL — ABNORMAL LOW (ref 6–20)
CO2: 20 mmol/L — ABNORMAL LOW (ref 22–32)
Calcium: 7.9 mg/dL — ABNORMAL LOW (ref 8.9–10.3)
Chloride: 108 mmol/L (ref 98–111)
Creatinine, Ser: 1.65 mg/dL — ABNORMAL HIGH (ref 0.44–1.00)
GFR calc non Af Amer: 40 mL/min — ABNORMAL LOW (ref >60)
GFR, EST AFRICAN AMERICAN: 46 mL/min — AB (ref >60)
Glucose, Bld: 162 mg/dL — ABNORMAL HIGH (ref 70–99)
Potassium: 2.8 mmol/L — ABNORMAL LOW (ref 3.5–5.1)
Sodium: 141 mmol/L (ref 135–145)

## 2018-01-24 LAB — BLOOD CULTURE ID PANEL (REFLEXED)

## 2018-01-24 LAB — PHOSPHORUS: Phosphorus: 4.5 mg/dL (ref 2.5–4.6)

## 2018-01-24 LAB — CBC
HCT: 23 % — ABNORMAL LOW (ref 36.0–46.0)
HCT: 19.7 % — ABNORMAL LOW (ref 36.0–46.0)
HCT: 17.7 % — ABNORMAL LOW (ref 36.0–46.0)
HEMOGLOBIN: 6.4 g/dL — AB (ref 12.0–15.0)
Hemoglobin: 8.5 g/dL — ABNORMAL LOW (ref 12.0–15.0)
Hemoglobin: 7.1 g/dL — ABNORMAL LOW (ref 12.0–15.0)
MCH: 33.9 pg (ref 26.0–34.0)
MCH: 32 pg (ref 26.0–34.0)
MCH: 32 pg (ref 26.0–34.0)
MCHC: 37 g/dL — ABNORMAL HIGH (ref 30.0–36.0)
MCHC: 36 g/dL (ref 30.0–36.0)
MCHC: 36.2 g/dL — ABNORMAL HIGH (ref 30.0–36.0)
MCV: 91.6 fL (ref 80.0–100.0)
MCV: 88.7 fL (ref 80.0–100.0)
MCV: 88.5 fL (ref 80.0–100.0)
Platelets: 53 10*3/uL — ABNORMAL LOW (ref 150–400)
Platelets: 30 10*3/uL — ABNORMAL LOW (ref 150–400)
Platelets: 49 10*3/uL — ABNORMAL LOW (ref 150–400)
RBC: 2.51 MIL/uL — ABNORMAL LOW (ref 3.87–5.11)
RBC: 2.22 MIL/uL — ABNORMAL LOW (ref 3.87–5.11)
RBC: 2 MIL/uL — ABNORMAL LOW (ref 3.87–5.11)
RDW: 21 % — ABNORMAL HIGH (ref 11.5–15.5)
RDW: 20.1 % — ABNORMAL HIGH (ref 11.5–15.5)
RDW: 20 % — ABNORMAL HIGH (ref 11.5–15.5)
WBC: 9.3 10*3/uL (ref 4.0–10.5)
WBC: 4.2 10*3/uL (ref 4.0–10.5)
WBC: 4.8 10*3/uL (ref 4.0–10.5)
nRBC: 0.5 % — ABNORMAL HIGH (ref 0.0–0.2)
nRBC: 0.8 % — ABNORMAL HIGH (ref 0.0–0.2)

## 2018-01-24 LAB — POCT I-STAT 3, ART BLOOD GAS (G3+)
Acid-base deficit: 2 mmol/L (ref 0.0–2.0)
Bicarbonate: 20.2 mmol/L (ref 20.0–28.0)
O2 Saturation: 99 %
Patient temperature: 94.6
TCO2: 21 mmol/L — ABNORMAL LOW (ref 22–32)
pCO2 arterial: 23.5 mmHg — ABNORMAL LOW (ref 32.0–48.0)
pH, Arterial: 7.534 — ABNORMAL HIGH (ref 7.350–7.450)
pO2, Arterial: 108 mmHg (ref 83.0–108.0)

## 2018-01-24 LAB — TSH: TSH: 0.656 u[IU]/mL (ref 0.350–4.500)

## 2018-01-24 LAB — MRSA PCR SCREENING
MRSA by PCR: NEGATIVE
MRSA by PCR: NEGATIVE

## 2018-01-24 LAB — MAGNESIUM
MAGNESIUM: 2.1 mg/dL (ref 1.7–2.4)
Magnesium: 1.1 mg/dL — ABNORMAL LOW (ref 1.7–2.4)
Magnesium: 1.1 mg/dL — ABNORMAL LOW (ref 1.7–2.4)
Magnesium: 2.3 mg/dL (ref 1.7–2.4)

## 2018-01-24 LAB — PROTIME-INR
INR: 2.24
INR: 2.37
INR: 1.97
Prothrombin Time: 24.5 seconds — ABNORMAL HIGH (ref 11.4–15.2)
Prothrombin Time: 25.6 seconds — ABNORMAL HIGH (ref 11.4–15.2)
Prothrombin Time: 22.1 seconds — ABNORMAL HIGH (ref 11.4–15.2)

## 2018-01-24 LAB — URINE DRUG SCREEN, QUALITATIVE (ARMC ONLY)
AMPHETAMINES, UR SCREEN: NOT DETECTED
Barbiturates, Ur Screen: NOT DETECTED
Benzodiazepine, Ur Scrn: POSITIVE — AB
Cannabinoid 50 Ng, Ur ~~LOC~~: NOT DETECTED
Cocaine Metabolite,Ur ~~LOC~~: NOT DETECTED
MDMA (Ecstasy)Ur Screen: NOT DETECTED
Methadone Scn, Ur: NOT DETECTED
Opiate, Ur Screen: NOT DETECTED
Phencyclidine (PCP) Ur S: NOT DETECTED
Tricyclic, Ur Screen: NOT DETECTED

## 2018-01-24 LAB — ABO/RH: ABO/RH(D): O POS

## 2018-01-24 LAB — ACETAMINOPHEN LEVEL: Acetaminophen (Tylenol), Serum: 26 ug/mL (ref 10–30)

## 2018-01-24 LAB — BETA-HYDROXYBUTYRIC ACID: Beta-Hydroxybutyric Acid: 0.34 mmol/L — ABNORMAL HIGH (ref 0.05–0.27)

## 2018-01-24 LAB — LACTIC ACID, PLASMA
Lactic Acid, Venous: 12.8 mmol/L (ref 0.5–1.9)
Lactic Acid, Venous: 12.5 mmol/L (ref 0.5–1.9)
Lactic Acid, Venous: 10.7 mmol/L (ref 0.5–1.9)

## 2018-01-24 LAB — POTASSIUM: Potassium: 2.9 mmol/L — ABNORMAL LOW (ref 3.5–5.1)

## 2018-01-24 LAB — SALICYLATE LEVEL: Salicylate Lvl: <7 mg/dL (ref 2.8–30.0)

## 2018-01-24 LAB — PREPARE RBC (CROSSMATCH)

## 2018-01-24 LAB — OCCULT BLOOD X 1 CARD TO LAB, STOOL: Fecal Occult Bld: POSITIVE — AB

## 2018-01-24 LAB — TRIGLYCERIDES: Triglycerides: 602 mg/dL — ABNORMAL HIGH (ref <150)

## 2018-01-24 MED ORDER — ACETAMINOPHEN 160 MG/5ML PO SOLN: 650.0000 mg | ORAL | Status: DC | PRN

## 2018-01-24 MED ORDER — PHENYLEPHRINE HCL-NACL 10-0.9 MG/250ML-% IV SOLN
0.0000 ug/min | INTRAVENOUS | Status: DC
  Administered 2018-01-24: 10 mg via INTRAVENOUS
  Administered 2018-01-24: 35 ug/min via INTRAVENOUS
  Administered 2018-01-25: 25 ug/min via INTRAVENOUS
  Administered 2018-01-25: 10 ug/min via INTRAVENOUS
  Filled 2018-01-24 (×2): qty 250

## 2018-01-24 MED ORDER — FENTANYL BOLUS VIA INFUSION
25.0000 ug | INTRAVENOUS | Status: DC | PRN
  Administered 2018-01-26: 25 ug via INTRAVENOUS
  Filled 2018-01-24: qty 25

## 2018-01-24 MED ORDER — MIDAZOLAM HCL 2 MG/2ML IJ SOLN: 2.0000 mg | INTRAMUSCULAR | Status: DC | PRN

## 2018-01-24 MED ORDER — FENTANYL CITRATE (PF) 100 MCG/2ML IJ SOLN: 50.0000 ug | Freq: Once | INTRAMUSCULAR | Status: DC

## 2018-01-24 MED ORDER — SPIRONOLACTONE 25 MG PO TABS: 50.0000 mg | ORAL_TABLET | Freq: Every day | ORAL | Status: DC

## 2018-01-24 MED ORDER — SODIUM CHLORIDE 0.9% IV SOLUTION: Freq: Once | INTRAVENOUS | Status: DC

## 2018-01-24 MED ORDER — LORAZEPAM 2 MG/ML IJ SOLN: 1.0000 mg | INTRAMUSCULAR | Status: DC | PRN

## 2018-01-24 MED ORDER — FENTANYL 2500MCG IN NS 250ML (10MCG/ML) PREMIX INFUSION: 100.0000 ug/h | INTRAVENOUS | Status: DC

## 2018-01-24 MED ORDER — VECURONIUM BROMIDE 10 MG IV SOLR
INTRAVENOUS | Status: AC
  Administered 2018-01-24: 10 mg via INTRAVENOUS
  Filled 2018-01-24: qty 10

## 2018-01-24 MED ORDER — SODIUM CHLORIDE 0.9% IV SOLUTION
Freq: Once | INTRAVENOUS | Status: AC
  Administered 2018-01-24: 21:00:00 via INTRAVENOUS

## 2018-01-24 MED ORDER — PREDNISOLONE 5 MG PO TABS
40.0000 mg | ORAL_TABLET | Freq: Every day | ORAL | Status: DC
  Filled 2018-01-24: qty 8

## 2018-01-24 MED ORDER — PHENYLEPHRINE HCL-NACL 10-0.9 MG/250ML-% IV SOLN
25.0000 ug/min | INTRAVENOUS | Status: DC
  Administered 2018-01-24: 25 ug/min via INTRAVENOUS
  Filled 2018-01-24: qty 250

## 2018-01-24 MED ORDER — SODIUM BICARBONATE 8.4 % IV SOLN
INTRAVENOUS | Status: DC
  Administered 2018-01-24: 05:00:00 via INTRAVENOUS
  Filled 2018-01-24 (×4): qty 150

## 2018-01-24 MED ORDER — SODIUM CHLORIDE 0.9% IV SOLUTION
Freq: Once | INTRAVENOUS | Status: AC
  Administered 2018-01-25: 02:00:00 via INTRAVENOUS

## 2018-01-24 MED ORDER — ETOMIDATE 2 MG/ML IV SOLN
18.0000 mg | Freq: Once | INTRAVENOUS | Status: AC
  Administered 2018-01-24: 18 mg via INTRAVENOUS

## 2018-01-24 MED ORDER — POTASSIUM CHLORIDE 10 MEQ/100ML IV SOLN
10.0000 meq | INTRAVENOUS | Status: AC
  Administered 2018-01-24: 10 meq via INTRAVENOUS
  Filled 2018-01-24 (×3): qty 100

## 2018-01-24 MED ORDER — INSULIN ASPART 100 UNIT/ML ~~LOC~~ SOLN
0.0000 [IU] | SUBCUTANEOUS | Status: DC
  Administered 2018-01-27: 1 [IU] via SUBCUTANEOUS

## 2018-01-24 MED ORDER — SODIUM CHLORIDE 0.9 % IV SOLN
3.0000 g | Freq: Four times a day (QID) | INTRAVENOUS | Status: DC
  Filled 2018-01-24 (×3): qty 3

## 2018-01-24 MED ORDER — MAGNESIUM SULFATE 4 GM/100ML IV SOLN
4.0000 g | Freq: Once | INTRAVENOUS | Status: AC
  Administered 2018-01-24: 4 g via INTRAVENOUS
  Filled 2018-01-24: qty 100

## 2018-01-24 MED ORDER — SODIUM CHLORIDE 0.9 % IV SOLN: 250.0000 mL | INTRAVENOUS | Status: DC

## 2018-01-24 MED ORDER — VALACYCLOVIR HCL 500 MG PO TABS
1000.0000 mg | ORAL_TABLET | Freq: Every day | ORAL | Status: DC
  Administered 2018-01-24: 1000 mg
  Filled 2018-01-24: qty 2

## 2018-01-24 MED ORDER — METRONIDAZOLE 500 MG PO TABS
500.0000 mg | ORAL_TABLET | Freq: Three times a day (TID) | ORAL | Status: DC
  Filled 2018-01-24: qty 1

## 2018-01-24 MED ORDER — STROKE: EARLY STAGES OF RECOVERY BOOK
Freq: Once | Status: DC
  Filled 2018-01-24: qty 1

## 2018-01-24 MED ORDER — PANTOPRAZOLE SODIUM 40 MG IV SOLR
40.0000 mg | Freq: Two times a day (BID) | INTRAVENOUS | Status: DC
  Administered 2018-01-25 – 2018-01-27 (×6): 40 mg via INTRAVENOUS
  Filled 2018-01-24 (×6): qty 40

## 2018-01-24 MED ORDER — SODIUM CHLORIDE 0.9 % IV SOLN
2.0000 g | INTRAVENOUS | Status: DC
  Administered 2018-01-24: 2 g via INTRAVENOUS
  Filled 2018-01-24: qty 20

## 2018-01-24 MED ORDER — SENNOSIDES-DOCUSATE SODIUM 8.6-50 MG PO TABS: 1.0000 | ORAL_TABLET | Freq: Two times a day (BID) | ORAL | Status: DC

## 2018-01-24 MED ORDER — VITAMIN K1 10 MG/ML IJ SOLN
10.0000 mg | Freq: Once | INTRAVENOUS | Status: AC
  Administered 2018-01-24: 10 mg via INTRAVENOUS
  Filled 2018-01-24: qty 1

## 2018-01-24 MED ORDER — NADOLOL 20 MG PO TABS
20.0000 mg | ORAL_TABLET | Freq: Every day | ORAL | Status: DC
  Filled 2018-01-24: qty 1

## 2018-01-24 MED ORDER — SODIUM CHLORIDE 0.9 % IV BOLUS
1000.0000 mL | Freq: Once | INTRAVENOUS | Status: AC
  Administered 2018-01-24: 1000 mL via INTRAVENOUS

## 2018-01-24 MED ORDER — SODIUM CHLORIDE 0.9 % IV SOLN
1.0000 g | INTRAVENOUS | Status: DC
  Filled 2018-01-24: qty 10

## 2018-01-24 MED ORDER — ALBUTEROL SULFATE (2.5 MG/3ML) 0.083% IN NEBU: 2.5000 mg | INHALATION_SOLUTION | RESPIRATORY_TRACT | Status: DC | PRN

## 2018-01-24 MED ORDER — PROPOFOL 1000 MG/100ML IV EMUL
INTRAVENOUS | Status: AC
  Administered 2018-01-24: 40 ug/kg/min via INTRAVENOUS
  Filled 2018-01-24: qty 100

## 2018-01-24 MED ORDER — FUROSEMIDE 40 MG PO TABS: 40.0000 mg | ORAL_TABLET | Freq: Every day | ORAL | Status: DC

## 2018-01-24 MED ORDER — POTASSIUM CHLORIDE 20 MEQ PO PACK
40.0000 meq | PACK | Freq: Once | ORAL | Status: AC
  Administered 2018-01-24: 40 meq
  Filled 2018-01-24: qty 2

## 2018-01-24 MED ORDER — METRONIDAZOLE 500 MG PO TABS
500.0000 mg | ORAL_TABLET | Freq: Three times a day (TID) | ORAL | Status: DC
  Filled 2018-01-24 (×2): qty 1

## 2018-01-24 MED ORDER — FENTANYL 2500MCG IN NS 250ML (10MCG/ML) PREMIX INFUSION
25.0000 ug/h | INTRAVENOUS | Status: DC
  Administered 2018-01-25: 100 ug/h via INTRAVENOUS
  Filled 2018-01-24: qty 250

## 2018-01-24 MED ORDER — PROPOFOL 1000 MG/100ML IV EMUL: 5.0000 ug/kg/min | INTRAVENOUS | Status: DC

## 2018-01-24 MED ORDER — ACETAMINOPHEN 325 MG PO TABS: 650.0000 mg | ORAL_TABLET | ORAL | Status: DC | PRN

## 2018-01-24 MED ORDER — VALACYCLOVIR HCL 500 MG PO TABS
1000.0000 mg | ORAL_TABLET | Freq: Every day | ORAL | Status: DC
  Filled 2018-01-24: qty 2

## 2018-01-24 MED ORDER — DEXMEDETOMIDINE HCL IN NACL 400 MCG/100ML IV SOLN: 0.2000 ug/kg/h | INTRAVENOUS | Status: DC

## 2018-01-24 MED ORDER — LORAZEPAM 2 MG/ML IJ SOLN
2.0000 mg | Freq: Once | INTRAMUSCULAR | Status: AC
  Administered 2018-01-24: 2 mg via INTRAVENOUS

## 2018-01-24 MED ORDER — SODIUM CHLORIDE 0.9 % IV SOLN: 2.0000 g | INTRAVENOUS | Status: DC

## 2018-01-24 MED ORDER — VECURONIUM BROMIDE 10 MG IV SOLR
10.0000 mg | Freq: Once | INTRAVENOUS | Status: AC
  Administered 2018-01-24: 10 mg via INTRAVENOUS

## 2018-01-24 MED ORDER — PANTOPRAZOLE SODIUM 40 MG IV SOLR: 40.0000 mg | Freq: Every day | INTRAVENOUS | Status: DC

## 2018-01-24 MED ORDER — SODIUM CHLORIDE 0.9 % IV SOLN
1.0000 g | Freq: Two times a day (BID) | INTRAVENOUS | Status: DC
  Filled 2018-01-24 (×2): qty 1

## 2018-01-24 MED ORDER — SODIUM CHLORIDE 0.9% IV SOLUTION
Freq: Once | INTRAVENOUS | Status: AC
  Administered 2018-01-24: 22:00:00 via INTRAVENOUS

## 2018-01-24 MED ORDER — SUCCINYLCHOLINE CHLORIDE 20 MG/ML IJ SOLN
90.0000 mg | Freq: Once | INTRAMUSCULAR | Status: AC
  Administered 2018-01-24: 90 mg via INTRAVENOUS

## 2018-01-24 MED ORDER — FENTANYL 2500MCG IN NS 250ML (10MCG/ML) PREMIX INFUSION
100.0000 ug/h | INTRAVENOUS | Status: DC
  Administered 2018-01-24: 125 ug/h via INTRAVENOUS
  Administered 2018-01-24 (×2): 100 ug/h via INTRAVENOUS

## 2018-01-24 MED ORDER — LORAZEPAM 2 MG/ML IJ SOLN
INTRAMUSCULAR | Status: AC
  Filled 2018-01-24: qty 1

## 2018-01-24 MED ORDER — POTASSIUM CHLORIDE 10 MEQ/50ML IV SOLN
10.0000 meq | INTRAVENOUS | Status: AC
  Administered 2018-01-25 (×6): 10 meq via INTRAVENOUS
  Filled 2018-01-24 (×6): qty 50

## 2018-01-24 MED ORDER — FENTANYL 2500MCG IN NS 250ML (10MCG/ML) PREMIX INFUSION
25.0000 ug/h | INTRAVENOUS | Status: DC
  Administered 2018-01-24 (×2): 150 ug/h via INTRAVENOUS
  Filled 2018-01-24: qty 250

## 2018-01-24 MED ORDER — NADOLOL 20 MG PO TABS
20.0000 mg | ORAL_TABLET | Freq: Every day | ORAL | Status: DC
  Administered 2018-01-24: 20 mg
  Filled 2018-01-24: qty 1

## 2018-01-24 MED ORDER — ONDANSETRON HCL 4 MG/2ML IJ SOLN: 4.0000 mg | Freq: Four times a day (QID) | INTRAMUSCULAR | Status: DC | PRN

## 2018-01-24 MED ORDER — ALBUMIN HUMAN 25 % IV SOLN
25.0000 g | Freq: Once | INTRAVENOUS | Status: DC
  Filled 2018-01-24 (×2): qty 100

## 2018-01-24 MED ORDER — PROPOFOL 1000 MG/100ML IV EMUL
5.0000 ug/kg/min | INTRAVENOUS | Status: DC
  Administered 2018-01-24: 40 ug/kg/min via INTRAVENOUS

## 2018-01-24 MED ORDER — FENTANYL BOLUS VIA INFUSION
50.0000 ug | INTRAVENOUS | Status: DC | PRN
  Administered 2018-01-24: 50 ug via INTRAVENOUS
  Filled 2018-01-24: qty 50

## 2018-01-24 MED ORDER — ACETAMINOPHEN 650 MG RE SUPP: 650.0000 mg | RECTAL | Status: DC | PRN

## 2018-01-24 MED ORDER — FOLIC ACID 1 MG PO TABS
1.0000 mg | ORAL_TABLET | Freq: Every day | ORAL | Status: DC
  Administered 2018-01-24: 1 mg
  Filled 2018-01-24: qty 1

## 2018-01-24 MED ORDER — MANNITOL 20 % IV SOLN
60.0000 g | Freq: Once | Status: AC
  Administered 2018-01-24: 60 g via INTRAVENOUS
  Filled 2018-01-24: qty 300

## 2018-01-24 MED ORDER — PHENYLEPHRINE HCL-NACL 10-0.9 MG/250ML-% IV SOLN
INTRAVENOUS | Status: AC
  Administered 2018-01-24: 10 mg via INTRAVENOUS
  Filled 2018-01-24: qty 250

## 2018-01-24 MED ORDER — ADULT MULTIVITAMIN LIQUID CH
15.0000 mL | Freq: Every day | ORAL | Status: DC
  Administered 2018-01-24: 15 mL via ORAL
  Filled 2018-01-24: qty 15

## 2018-01-24 MED ORDER — VITAMIN B-1 100 MG PO TABS
100.0000 mg | ORAL_TABLET | Freq: Every day | ORAL | Status: DC
  Administered 2018-01-24: 100 mg
  Filled 2018-01-24: qty 1

## 2018-01-24 MED ORDER — LACTULOSE 10 GM/15ML PO SOLN: 20.0000 g | Freq: Three times a day (TID) | ORAL | Status: DC

## 2018-01-24 MED ORDER — PANTOPRAZOLE SODIUM 40 MG PO PACK
40.0000 mg | PACK | Freq: Every day | ORAL | Status: DC
  Administered 2018-01-24: 40 mg
  Filled 2018-01-24: qty 20

## 2018-01-24 MED ORDER — FENTANYL 2500MCG IN NS 250ML (10MCG/ML) PREMIX INFUSION
INTRAVENOUS | Status: AC
  Administered 2018-01-24: 100 ug/h via INTRAVENOUS
  Filled 2018-01-24: qty 250

## 2018-01-24 MED ORDER — PHENYLEPHRINE HCL-NACL 10-0.9 MG/250ML-% IV SOLN
0.0000 ug/min | INTRAVENOUS | Status: DC
  Filled 2018-01-24 (×2): qty 250

## 2018-01-24 MED ORDER — POTASSIUM CHLORIDE 20 MEQ/15ML (10%) PO SOLN
40.0000 meq | Freq: Once | ORAL | Status: AC
  Administered 2018-01-24: 40 meq
  Filled 2018-01-24: qty 30

## 2018-01-24 MED ORDER — SODIUM CHLORIDE 0.9 % IV SOLN
5.0000 mg/h | INTRAVENOUS | Status: DC
  Administered 2018-01-24: 5 mg/h via INTRAVENOUS
  Filled 2018-01-24: qty 10

## 2018-01-24 MED ORDER — PREDNISOLONE 15 MG/5ML PO SOLN
40.0000 mg | Freq: Every day | ORAL | Status: DC
  Administered 2018-01-24: 40 mg
  Filled 2018-01-24: qty 13.3

## 2018-01-24 MED ORDER — POTASSIUM CHLORIDE IN NACL 20-0.9 MEQ/L-% IV SOLN
INTRAVENOUS | Status: DC
  Administered 2018-01-24: 05:00:00 via INTRAVENOUS
  Filled 2018-01-24 (×2): qty 1000

## 2018-01-24 MED ORDER — CHLORHEXIDINE GLUCONATE 0.12% ORAL RINSE (MEDLINE KIT)
15.0000 mL | Freq: Two times a day (BID) | OROMUCOSAL | Status: DC
  Administered 2018-01-24: 15 mL via OROMUCOSAL

## 2018-01-24 MED ORDER — ONDANSETRON HCL 4 MG PO TABS: 4.0000 mg | ORAL_TABLET | Freq: Four times a day (QID) | ORAL | Status: DC | PRN

## 2018-01-24 MED ORDER — PANTOPRAZOLE SODIUM 40 MG IV SOLR
40.0000 mg | INTRAVENOUS | Status: DC
  Administered 2018-01-24: 40 mg via INTRAVENOUS
  Filled 2018-01-24: qty 40

## 2018-01-24 MED ORDER — ORAL CARE MOUTH RINSE
15.0000 mL | OROMUCOSAL | Status: DC
  Administered 2018-01-24 (×5): 15 mL via OROMUCOSAL

## 2018-01-24 MED ORDER — LACTULOSE 10 GM/15ML PO SOLN
20.0000 g | Freq: Three times a day (TID) | ORAL | Status: DC
  Administered 2018-01-24: 20 g
  Filled 2018-01-24: qty 30

## 2018-01-24 NOTE — Progress Notes (Signed)
eLink Physician-Brief Progress Note Patient Name: Janet Weiss DOB: [birth year] MRN: [id number]   Date of Service  01/02/2018  HPI/Events of Note  Patient remains acidotic lactate still at 12. Creatinine improving but still oligoanuric. BP stable off pressors  eICU Interventions  Increase bicarb drip and decrease NS with 20 K.  Will replete K via OG.     Intervention Category Major Interventions: Acid-Base disturbance - evaluation and management Intermediate Interventions: Electrolyte abnormality - evaluation and management  Darl Pikesmily T Shimika Ames 01/02/2018, 6:21 AM

## 2018-01-24 NOTE — Progress Notes (Addendum)
Report called to Amy on 4N. I reported to her that this patient has orders for 4 units of FFP. Patient has received 2 of the four units. The patient also has orders for 1 unit of platelets. [facility] the [facility]'s arrival, [facility]

## 2018-01-24 NOTE — Progress Notes (Signed)
eLink Physician-Brief Progress Note Patient Name: Janet Weiss DOB: [birth year] MRN: [id number]   Date of Service  Nov 07, 2017  HPI/Events of Note  ETOH cirrhosis with Gr I esophageal varices. Metabolic acidosis secondary to lactic acidosis of 12, ketoacidosis and AKI. Has been having abdominal pains. Blood ETOH level 160. Now intubated. Bilious output on OG.  eICU Interventions  ACLF t/c SBP ongoing fluids and bicarb. K correction. Cultures ordered, ceftriaxone started. Place on PPI and SCDs. Awaiting repeat labs including coags. Vent setting to correct for metabolic acidosis. Repeat ABG after initiation of bicarb. May need diagnostic as well as therapeutic paracentesis.     Intervention Category Major Interventions: Acid-Base disturbance - evaluation and management;Respiratory failure - evaluation and management;Electrolyte abnormality - evaluation and management Intermediate Interventions: Change in mental status - evaluation and management;Coagulopathy - evaluation and management Minor Interventions: Agitation / anxiety - evaluation and management Evaluation Type: New Patient Evaluation  Janet Weiss Nov 07, 2017, 5:07 AM

## 2018-01-24 NOTE — Progress Notes (Signed)
PHARMACIST - PHYSICIAN COMMUNICATION  CONCERNING: IV to Oral Route Change Policy  RECOMMENDATION: This patient is receiving pantoprazole by the intravenous route.  Based on criteria approved by the Pharmacy and Therapeutics Committee, the intravenous medication(s) is/are being converted to the equivalent oral dose form(s).   DESCRIPTION: These criteria include:  The patient is eating (either orally or via tube) and/or has been taking other orally administered medications for a least 24 hours  The patient has no evidence of active gastrointestinal bleeding or impaired GI absorption (gastrectomy, short bowel, patient on TNA or NPO).  If you have questions about this conversion, please contact the Pharmacy Department   [x]   [phone]( [phone] )  [facility]  Simpson,Michael L, [facility] December 29, 2017 8:52 AM

## 2018-01-24 NOTE — Consult Note (Signed)
Reason for Consult:IVH Referring Physician: [place]  CC: AMS  HPI: Janet Weiss is an 32 y.o. female with a history of ETOH and cirrhosis who presented with AMS.  Patient unable to provide any history due to mental status and family not available at this time therefore all history obtained from the chart.  Patient was with a friend in the ED.  She appeared to be confused and was speaking incoherently which prompted ED nursing to triage the patient who reported shortness of breath and abdominal pain.  She has multiple metabolic issues and eventually developed respiratory failure requiring intubation.  Patient without improvement in status and imaging performed.    Past Medical History:  Diagnosis Date  . Alcohol dependence ([place])   . Ascites   . Cirrhosis ([place])   . GERD (gastroesophageal reflux disease)     Past Surgical History:  Procedure Laterality Date  . ESOPHAGOGASTRODUODENOSCOPY N/A 06/28/2014   Procedure: ESOPHAGOGASTRODUODENOSCOPY (EGD);  Surgeon: Hulen Luster, MD;  Location: [facility] ENDOSCOPY;  Service: Gastroenterology;  Laterality: N/A;   Family history: Father with no known medical problems.  Mother with a history of CAD and HTN.  Maternal grandfather and grandmother with lung cancer.  Social History:  reports that she has been smoking cigarettes. She has been smoking about 0.50 packs per day. She has never used smokeless tobacco. She reports current alcohol use. She reports that she does not use drugs.  No Known Allergies  Medications:  I have reviewed the patient's current medications. Prior to Admission:  Medications Prior to Admission  Medication Sig Dispense Refill Last Dose  . albuterol (PROVENTIL HFA;VENTOLIN HFA) 108 (90 Base) MCG/ACT inhaler Inhale 2 puffs into the lungs every 6 (six) hours as needed for wheezing or shortness of breath. 1 Inhaler 0   . benzonatate (TESSALON PERLES) 100 MG capsule Take 1 capsule (100 mg total) by mouth 3 (three) times daily as needed  for cough (Take 1-2 per dose). 30 capsule 0   . busPIRone (BUSPAR) 7.5 MG tablet Take 7.5 mg by mouth 2 (two) times daily as needed (anxiety).    03/27/2014  . dexlansoprazole (DEXILANT) 60 MG capsule Take 60 mg by mouth daily.   06/07/2014 at 0630  . dibucaine (NUPERCAINAL) 1 % OINT Place 1 application rectally 3 (three) times daily as needed for pain. 30 g 0   . fluticasone (FLONASE) 50 MCG/ACT nasal spray Place 2 sprays into both nostrils daily. 16 g 0   . furosemide (LASIX) 40 MG tablet Take 40 mg by mouth daily.    06/07/2014 at 0630  . Krill Oil 300 MG CAPS Take 350 mg by mouth daily.    06/07/2014 at 0630  . lactulose (CHRONULAC) 10 GM/15ML solution Take 30 mLs by mouth 3 (three) times daily.     . nadolol (CORGARD) 20 MG tablet Take 20 mg by mouth daily.     . ondansetron (ZOFRAN ODT) 4 MG disintegrating tablet Take 1 tablet (4 mg total) by mouth every 8 (eight) hours as needed for nausea or vomiting. 15 tablet 0   . pantoprazole (PROTONIX) 40 MG tablet Take 40 mg by mouth 2 (two) times daily.     . potassium chloride (K-DUR) 10 MEQ tablet Take 10 mEq by mouth daily.     . Prenatal Multivit-Min-Fe-FA (PRE-NATAL PO) Take 1 tablet by mouth daily.     . ranitidine (ZANTAC) 300 MG tablet Take 300 mg by mouth at bedtime.     . sertraline (ZOLOFT) 25 MG  tablet Take 25 mg by mouth 2 (two) times daily.    03/27/2014  . spironolactone (ALDACTONE) 50 MG tablet Take 50 mg by mouth daily.   06/07/2014 at 063  . sulfamethoxazole-trimethoprim (BACTRIM DS,SEPTRA DS) 800-160 MG per tablet Take 1 tablet by mouth 2 (two) times daily as needed (cyists). X 3 days     . traMADol (ULTRAM) 50 MG tablet Take 50 mg by mouth every 6 (six) hours as needed.     . valACYclovir (VALTREX) 1000 MG tablet Take 1,000 mg by mouth daily.    06/07/2014 at 0630   Scheduled: . sodium chloride   Intravenous Once  . chlorhexidine gluconate (MEDLINE KIT)  15 mL Mouth Rinse BID  . fentaNYL (SUBLIMAZE) injection  50 mcg Intravenous Once   . folic acid  1 mg Per Tube Daily  . lactulose  20 g Per Tube TID  . mouth rinse  15 mL Mouth Rinse 10 times per day  . metroNIDAZOLE  500 mg Oral Q8H  . multivitamin  15 mL Oral Daily  . nadolol  20 mg Per Tube Daily  . pantoprazole sodium  40 mg Per Tube Daily  . prednisoLONE  40 mg Per Tube Daily  . [START ON 01/25/2018] spironolactone  50 mg Per Tube Daily  . thiamine  100 mg Per Tube Daily  . valACYclovir  1,000 mg Per Tube Daily    ROS: Unable to provide due to mental statu  Physical Examination: Blood pressure (!) 106/53, pulse (!) 125, temperature 97.6 F (36.4 C), temperature source Axillary, resp. rate (!) 26, height 5' 2"  (1.575 m), weight 59.5 kg, SpO2 100 %.  HEENT-  Normocephalic, no lesions, without obvious abnormality.  Normal external eye and conjunctiva.  Normal TM's bilaterally.  Normal auditory canals and external ears. Normal external nose, mucus membranes and septum.  Normal pharynx. Cardiovascular- S1, S2 normal, pulses palpable throughout   Lungs- chest clear, no wheezing, rales, normal symmetric air entry Abdomen- soft, non-tender; bowel sounds normal; no masses,  no organomegaly Extremities- no edema Lymph-no adenopathy palpable Musculoskeletal-no joint tenderness, deformity or swelling Skin-warm and dry, no hyperpigmentation, vitiligo, or suspicious lesions  Neurological Examination   Mental Status: Patient does not respond to verbal stimuli.  Does not respond to deep sternal rub.  Does not follow commands.  No verbalizations noted.  Cranial Nerves: II: patient does not respond confrontation bilaterally, pupils right 5 mm, left 4 mm,and unreactive bilaterally III,IV,VI: doll's response absent bilaterally.  V,VII: corneal reflex absent bilaterally  VIII: patient does not respond to verbal stimuli IX,X: gag reflex absent, XI: trapezius strength unable to test bilaterally XII: tongue strength unable to test Motor: Extremities flaccid throughout.  No  spontaneous movement noted.  No purposeful movements noted. Sensory: Does not respond to noxious stimuli in any extremity. Deep Tendon Reflexes:  2+ throughout. Plantars: upgoing bilaterally Cerebellar: Unable to perform   Laboratory Studies:   Basic Metabolic Panel: Recent Labs  Lab 01/23/18 2233 01/23/18 2246 01/17/2018 0516  NA 137  --  136  K 2.7*  --  3.0*  CL 108  --  109  CO2 11*  --  8*  GLUCOSE 104*  --  109*  BUN <5*  --  <5*  CREATININE 1.58*  --  1.32*  CALCIUM 8.3*  --  7.5*  MG  --  1.1* 1.1*  PHOS  --   --  4.5    Liver Function Tests: Recent Labs  Lab 01/23/18 2233 01/23/2018 0516  AST 174* 152*  ALT 36 30  ALKPHOS 174* 130*  BILITOT 5.9* 5.8*  PROT 7.9 6.8  ALBUMIN 2.4* 2.0*   Recent Labs  Lab 01/23/18 2233  LIPASE 48   Recent Labs  Lab 01/23/18 2307  AMMONIA 36*    CBC: Recent Labs  Lab 01/23/18 2233 01/13/2018 0516  WBC 5.1 9.3  HGB 9.5* 8.5*  HCT 26.0* 23.0*  MCV 89.0 91.6  PLT 53* 53*    Cardiac Enzymes: Recent Labs  Lab 01/23/18 2246  TROPONINI <0.03    BNP: Invalid input(s): POCBNP  CBG: Recent Labs  Lab 01/18/2018 0239 01/03/2018 0513  GLUCAP 104* 103*    Microbiology: Results for orders placed or performed during the hospital encounter of 01/23/18  MRSA PCR Screening     Status: None   Collection Time: 01/15/2018  5:31 AM  Result Value Ref Range Status   MRSA by PCR NEGATIVE NEGATIVE Final    Comment:        The GeneXpert MRSA Assay (FDA approved for NASAL specimens only), is one component of a comprehensive MRSA colonization surveillance program. It is not intended to diagnose MRSA infection nor to guide or monitor treatment for MRSA infections. Performed at [facility], [address] [place], [place] [address]     Coagulation Studies: Recent Labs    01/21/2018 0516  LABPROT 24.5*  INR 2.24    Urinalysis:  Recent Labs  Lab 01/23/18 2233  COLORURINE YELLOW*  LABSPEC 1.010   PHURINE 5.0  GLUCOSEU NEGATIVE  HGBUR SMALL*  BILIRUBINUR NEGATIVE  KETONESUR NEGATIVE  PROTEINUR NEGATIVE  NITRITE NEGATIVE  LEUKOCYTESUR NEGATIVE    Lipid Panel:     Component Value Date/Time   TRIG 602 (H) 01/18/2018 0516    HgbA1C: No results found for: HGBA1C  Urine Drug Screen:      Component Value Date/Time   LABOPIA NONE DETECTED 01/03/2018 1104   COCAINSCRNUR NONE DETECTED 12/30/2017 1104   LABBENZ POSITIVE (A) 01/13/2018 1104   AMPHETMU NONE DETECTED 01/03/2018 1104   THCU NONE DETECTED 01/21/2018 1104   LABBARB NONE DETECTED 12/31/2017 1104    Alcohol Level:  Recent Labs  Lab 01/23/18 2233  ETH 160*    Other results: EKG: sinus tachycardia at 129 bpm.  Imaging: Ct Head Wo Contrast  Result Date: 01/22/2018 CLINICAL DATA:  Altered mental status.  Alcohol abuse.  Cirrhosis. EXAM: CT HEAD WITHOUT CONTRAST TECHNIQUE: Contiguous axial images were obtained from the base of the skull through the vertex without intravenous contrast. COMPARISON:  None. FINDINGS: Brain: There is moderately large volume acute hemorrhage in the lateral and third ventricles with small volume blood in the fourth ventricle. The lateral ventricles are severely dilated with periventricular hypoattenuation consistent with acute hydrocephalus and transependymal CSF flow. The third ventricle is also dilated. There is a 6 mm focus of hemorrhage which appears to be within the medial right thalamus, immediately adjacent to blood products in the third ventricle. There is mild surrounding edema in the right thalamus. There is no midline shift, however the cerebral sulci are diffusely effaced, and there is effacement of the basilar cisterns. There is no cerebellar tonsillar herniation. No acute large territory vascular infarct is identified. There is no extra-axial fluid collection. Vascular: No hyperdense vessel. Skull: No fracture or focal osseous lesion. Sinuses/Orbits: Small volume bubbly secretions  in the left sphenoid sinus. Clear mastoid air cells. Unremarkable included orbits. Other: None. IMPRESSION: 1. Small acute right thalamic hemorrhage with intraventricular extension. Moderately large  volume of intraventricular blood with severe hydrocephalus. 2. Diffuse cerebral sulcal and basilar cistern effacement. No midline shift or tonsillar herniation. Critical Value/emergent results were called by telephone at the time of interpretation on 01/06/2018 at 2:30 pm to Dr. Hermelinda Dellen , who verbally acknowledged these results. Electronically Signed   By: Logan Bores M.D.   On: 01/05/2018 14:34   Dg Chest Port 1 View  Result Date: 01/11/2018 CLINICAL DATA:  Intubation EXAM: PORTABLE CHEST 1 VIEW COMPARISON:  01/01/2018 FINDINGS: Endotracheal tube tip is at the level of the clavicular heads. Side port of the nasogastric tube projects over the stomach. The heart size and mediastinal contours are within normal limits. Both lungs are clear. The visualized skeletal structures are unremarkable. IMPRESSION: ET tube tip at the level of the clavicular heads. Electronically Signed   By: Ulyses Jarred M.D.   On: 01/12/2018 04:46   Dg Chest Portable 1 View  Result Date: 01/12/2018 CLINICAL DATA:  Altered mental status tachycardia EXAM: PORTABLE CHEST 1 VIEW COMPARISON:  None. FINDINGS: Normal heart size. Normal mediastinal contour. No pneumothorax. No pleural effusion. Low lung volumes. No pulmonary edema. No acute consolidative airspace disease. IMPRESSION: Low lung volumes with no active cardiopulmonary disease. Electronically Signed   By: Ilona Sorrel M.D.   On: 01/10/2018 02:58     Assessment/Plan: 32 year old female with a history of alcoholic cirrhosis who presented with AMS and in respiratory distress.  Required intubation and sedation.  Head CT performed today and reviewed.  Head CT shows evidence of a right thalamic hemorrhage with intraventicular extension and evidence of hydrocephalus.  Exam  concerning with fixed and unequal pupils although this is a recent development.  Patient with coagulopathy with INR of 2.24 and PT of 24.5.  Patient on no anticoagulation prior to admission.   Recommendations: 1.  Agree with reversal of coagulopathy using FFP and Vitamin K 2.  High dose IV mannitol 3.  Patient to be transferred to higher level of care for possible drain placement.  Accepted at [facility]. 4.  Continued frequent neuro checks  This patient is critically ill and at significant risk of neurological worsening, death and care requires constant monitoring of vital signs, hemodynamics,respiratory and cardiac monitoring, neurological assessment, discussion with family (mother), other specialists and medical decision making of high complexity. I spent 60 minutes of neurocritical care time  in the care of  this patient.   Alexis Goodell, MD Neurology [phone] 01/11/2018, 2:43 PM     [facility]

## 2018-01-24 NOTE — Consult Note (Signed)
Case discussed with Dr. Thad Rangereynolds in Neurology.    Unfortunately, 32 yo female with alcoholism and cirrhosis, liver failure presented with AMS and had worsening neurological status today prompting HCT.  HCT shows IVH into the lateral and 3rd ventricles, causing obstructive hydrocephalus.  Based on Dr. Thad Rangereynolds' description, the patient is in grave neurological condition.  She requires transfer to another site with higher level of Neurosurgical care.  [facility] is currently on divert for Neuro ICU beds, so I have advised for transfer to another tertiary institution.  Based on the findings of the HCT and neurological examination by Dr. Thad Rangereynolds, I think Janet Weiss is likely to have a poor outcome, and will likely pass away.  Dr. Thad Rangereynolds spoke with family to relay this information.

## 2018-01-24 NOTE — Progress Notes (Signed)
eLink Physician-Brief Progress Note Patient Name: Janet Weiss DOB: 01/17/1986 MRN: [id number]   Date of Service  02-26-17  HPI/Events of Note  Hypokalemia  eICU Interventions  Potassium replaced     Intervention Category Intermediate Interventions: Electrolyte abnormality - evaluation and management  DETERDING,ELIZABETH 02-26-17, 11:25 PM

## 2018-01-24 NOTE — Progress Notes (Signed)
Late RT entry: Patient was intubated in ed by Dr York CeriseForbach.  Patient was placed on vent AC 420 30 35% peep 5. ET tube 7.5 24 cm at lip positioned on right. CXR verified and viewed by Dr Sheryle Hailiamond. ABG critical results verified and view by Dr Sheryle Hailiamond and Dr York CeriseForbach. Patient was transferred to icu without incident.

## 2018-01-24 NOTE — H&P (Signed)
Neurology H&P  CC: Intracranial hemorrhage  History is obtained from: Chart review  HPI: Janet Weiss is a 32 y.o. female with a history of alcohol dependence, ascites, cirrhosis who presented to [facility] with confusion yesterday.  She was initially acidotic, and required bicarb drip.  She also had hypokalemia with a potassium of 2.7, AKI, bicarb 11 mildly elevated AST at 174.  She actually was given Nimbex overnight due to agitation.  Today a CT head was performed which showed a thalamic hemorrhage with extension into the ventricles and severe hydrocephalus.  Neurology was consulted with a fairly dismal exam and she was given mannitol and started on FFP for reversal. She has developed hypotension as well requiring neosynephrine.   [facility] for consideration of further intervention.   LKW: Unclear tpa given?: no, ICH ICH Score: 3  ROS:  Unable to obtain due to altered mental status.   Past Medical History:  Diagnosis Date  . Alcohol dependence ([facility])   . Ascites   . Cirrhosis ([facility])   . GERD (gastroesophageal reflux disease)      Family history: Unable to obtain due to altered mental status.   Social History:  reports that she has been smoking cigarettes. She has been smoking about 0.50 packs per day. She has never used smokeless tobacco. She reports current alcohol use. She reports that she does not use drugs.   Exam: Current vital signs: BP (!) 93/55   Pulse 84   Temp (!) 93.7 F (34.3 C) (Core)   Resp (!) 26   LMP  (LMP Unknown)   SpO2 100%  Vital signs in last 24 hours: Temp:  [93.4 F (34.1 C)-99.3 F (37.4 C)] 93.7 F (34.3 C) (12/30 1937) Pulse Rate:  [84-154] 84 (12/30 1937) Resp:  [18-35] 26 (12/30 1937) BP: (85-137)/(48-113) 93/55 (12/30 1937) SpO2:  [95 %-100 %] 100 % (12/30 1937) FiO2 (%):  [35 %] 35 % (12/30 1925) Weight:  [59.5 kg-59.9 kg] 59.5 kg (12/30 0500)  Physical Exam  Constitutional: Appears  well-developed and well-nourished.  Psych: Unresponsive Eyes: No scleral injection HENT: No OP obstrucion Head: Normocephalic.  Cardiovascular: Normal rate and regular rhythm.  Respiratory: Decreased BS at the bases GI: Distended, bowel sounds present.  Skin: WDI  Neuro: Mental Status: Patient is comatose but does not open eyes or follow commands Cranial Nerves: II: Does not blink to threat. Pupils are large, with bright light, there is an extraordinarily sluggish reduction in size of the pupil, 8->7  III,IV, VI: With doll's eye maneuver, she has left eye abduction V:VII: Weak corneal on the right, no corneal on the left Motor: She has minimal extensor posturing to noxious stimulation of the legs Sensory: As above Cerebellar: Does not perform  I have reviewed labs in epic with selected results below ABG from 4 PM 7.44/26/124 CBC from this morning platelets of 53,000 INR from this morning 2.24  I have reviewed the images obtained: CT head- large intraventricular hemorrhage that is likely extending from a small thalamic bleed.  She has extensive hydrocephalus with cerebral edema  Primary Diagnosis:  Intracranial hemorrhage   Secondary Diagnosis: Cerebral edema, Obstructive Hydrocephalus, Acute Respiratory Failure and Acute Kidney Failure Liver disease with ascites Metabolic acidosis Coma hypotension  Impression: 32 year old female with intracerebral hemorrhage causing hydrocephalus and herniation.  I suspect that she is going to have a poor outcome even with the most aggressive intervention, however an EVD could potentially be lifesaving, though to what outcome  I am not certain.  Unfortunately, due to coagulopathy this is currently precluded.   Unfortunately, family is not currently available, but certainly intervention will need to be discussed given the likely poor outcome even with it.  Recommendations: 1) FFP 2 units, platelet transfusion 2) I appreciate Dr.  Earl GalaNudelman's assistance 3) discussion with family 4) I have asked for consultation from critical care medicine for management of her medical comorbidities   This patient is critically ill and at significant risk of neurological worsening, death and care requires constant monitoring of vital signs, hemodynamics,respiratory and cardiac monitoring, neurological assessment, discussion with family, other specialists and medical decision making of high complexity. I spent 45 minutes of neurocritical care time  in the care of  this patient.  Janet SlotMcNeill Tassie Pollett, MD [facility] [phone]  If 7pm- 7am, please page neurology on call as listed in AMION. 01/15/2018  7:49 PM

## 2018-01-24 NOTE — Consult Note (Signed)
NAME:  Janet Weiss, MRN:  [id number], DOB:  03/13/1985, LOS: 0 ADMISSION DATE:  01-23-2018, CONSULTATION DATE:  12/30 REFERRING MD:  Dr Amada JupiterKirkpatrick, CHIEF COMPLAINT:  ICH  Brief History   32 year old female with alcoholic cirrhosis [facility]/30 with AMS and found to have ICH. She was transferred to [facility] for neurosurgery involvement.   History of present illness   32 year old female with past medical history as below, which is significant for alcoholic cirrhosis, questionable seizures, and GERD.  [facility] to [facility] ED in the late p.m. hours of 12/29 with complaints of abdominal swelling and confusion.  She was a poor historian, however, she reported progressive abdominal swelling over extended period of time.  She had stopped taking her diuretic medications. He last alcohol use was 12/29. She was found to have lactic acidosis, alcohol intoxication, and elevated ammonia level. She was admitted to the hospitalist service for correction of her metabolic encephalopathy. Course became complicated by respiratory failure and worsening mental status requiring intubation. CT of the head was obtained, which demonstrated ICH. [facility] was [facility] for ICU/Neurology/Neurosurgery management. INR elevated due to cirrhosis. Being corrected pending neurosurgical involvement.   Past Medical History   has a past medical history of Alcohol dependence (HCC), Ascites, Cirrhosis (HCC), and GERD (gastroesophageal reflux disease).  Significant Hospital Events   12/29 to ED for AMS and ascites.  12/30 intubated for resp failure and worsening AMS. Found to have ICH. Transferred to [facility].   Consults:  Neurosurgery PCCM Neurology primary  Procedures:  Femoral CVL [facility] > ETT 12/23 >  Significant Diagnostic Tests:  CT head 12/30 > Small acute right thalamic hemorrhage with intraventricular extension. Moderately large volume of intraventricular blood with severe  hydrocephalus.Diffuse cerebral sulcal and basilar cistern effacement. No midline shift or tonsillar herniation.  Micro Data:    Antimicrobials:    Interim history/subjective:    Objective   Blood pressure (!) 91/55, pulse 87, temperature (!) 93.4 F (34.1 C), resp. rate (!) 26, SpO2 100 %.    Vent Mode: PRVC FiO2 (%):  [35 %] 35 % Set Rate:  [26 bmp-30 bmp] 26 bmp Vt Set:  [420 mL] 420 mL PEEP:  [5 cmH20] 5 cmH20 Plateau Pressure:  [14 cmH20-20 cmH20] 20 cmH20  No intake or output data in the 24 hours ending 20-Apr-2017 1913 There were no vitals filed for this visit.  Examination: General: young adult female on vent.  HENT: [address]/AT, PERRL, no JVD Lungs: Clear bilateral breath sounds Cardiovascular: Tachy, regular, no MRG Abdomen: Distended, firm, umbilical protrusion.  Extremities: No acute deformity Neuro: Sedated on vent.  GU: Foley  Resolved Hospital Problem list     Assessment & Plan:   Intracranial hemorrhage: Small R thalamic hemorrhage with intraventricular extension. Has been evaluated by neurology and neurosurgery. Plan is for ventriculostomy assuming coagulopathy can be reasonably corrected.  - management per neurology neurosurgery - SBP goal 140 - 160 mmHg, however, currently requiring pressors due to shock.  - FFP/plateletes currently infusing with plans for repeat coags after.   Acute respiratory failure/Inabilty to protect airway in the setting of ICH - Full vent support - Minimize sedation as able to allow for neuro examination. RASS goal -1 to -2. Fentanyl infusion - CXR post transport to check tube position - ABG - VAP bundle  Shock - presumably distributive/neurogenic shock given ICH. No clear evidence sepsis. She has had hemoglobin drop from 9.5-7.1 over the course of  the day, but with exception of ICH, no other evidence of bleeding, however, she does have a recent history of melenic stools.  - Telemetry monitoring in the ICU - Continue phenylephrine  to keep MAP > .  - May need blood transfusion if Hgb drops below 7.  - Trend lactic acid, troponin.  - Occult blood card.   Metabolic acidosis:  Lactic acid elevated 12.8 > 10.7. Urine ketones negative. Gap and non-gap components. Likely related to liver failure.  - Will repeat ABG and chemistry panel.   Hepatic cirrhosis: secondary to alcoholism. Complicated chronically by hepatic encephalopathy, portal hypertensive gastropathy, ascites, and bleeding esophageal varices. Acutely complicated by coagulapathy, thrombocytopenia.  - Will have to hold diuresis, lactulose while on pressors. - Will need paracentesis once more stable and coagulopathy reversed.  - Follow coags, LFT - Protonix BID - Once more stable can initiate alcohol withdrawal precautions. Last drink 12/29.  Electrolyte abnormalities:  Hypokalemia, Hypocalcemia (pseudo), hypomagnesemia - Repeat chemistry   AKI - renal function panel now.   Global: suspect her prognosis to be poor given her multiple co-morbidities. If she does not improve quickly, may be reasonable to initiate goals of care discussions.    Best practice:  Diet: NPO Pain/Anxiety/Delirium protocol (if indicated): Fentanyl infusion. RASS goal -1 to -2 VAP protocol (if indicated): Per protocol DVT prophylaxis: Coagulopathic GI prophylaxis: Protonix BID Glucose control: SSI Mobility: BR Code Status: FULL code Family Communication: No family available Disposition: ICU.   Labs   CBC: Recent Labs  Lab 01/23/18 2233 Feb 04, 2018 0516  WBC 5.1 9.3  HGB 9.5* 8.5*  HCT 26.0* 23.0*  MCV 89.0 91.6  PLT 53* 53*    Basic Metabolic Panel: Recent Labs  Lab 01/23/18 2233 01/23/18 2246 02/04/18 0516 Feb 04, 2018 1436  NA 137  --  136  --   K 2.7*  --  3.0* 2.9*  CL 108  --  109  --   CO2 11*  --  8*  --   GLUCOSE 104*  --  109*  --   BUN <5*  --  <5*  --   CREATININE 1.58*  --  1.32*  --   CALCIUM 8.3*  --  7.5*  --   MG  --  1.1* 1.1* 2.3  PHOS   --   --  4.5  --    GFR: Estimated Creatinine Clearance: 47.5 mL/min (A) (by C-G formula based on SCr of 1.32 mg/dL (H)). Recent Labs  Lab 01/23/18 2233 01/23/18 2307 04-Feb-2018 0237 02/04/18 0516 Feb 04, 2018 0802  WBC 5.1  --   --  9.3  --   LATICACIDVEN  --  9.7* 12.8* 12.5* 10.7*    Liver Function Tests: Recent Labs  Lab 01/23/18 2233 02-04-18 0516  AST 174* 152*  ALT 36 30  ALKPHOS 174* 130*  BILITOT 5.9* 5.8*  PROT 7.9 6.8  ALBUMIN 2.4* 2.0*   Recent Labs  Lab 01/23/18 2233  LIPASE 48   Recent Labs  Lab 01/23/18 2307  AMMONIA 36*    ABG    Component Value Date/Time   PHART 7.44 02-04-2018 1601   PCO2ART 26 (L) 02-04-18 1601   PO2ART 124 (H) 2018/02/04 1601   HCO3 17.7 (L) 02/04/18 1601   ACIDBASEDEF 4.9 (H) 02-04-2018 1601   O2SAT 98.9 02/04/18 1601     Coagulation Profile: Recent Labs  Lab 04-Feb-2018 0516  INR 2.24    Cardiac Enzymes: Recent Labs  Lab 01/23/18 2246  TROPONINI <0.03  HbA1C: No results found for: HGBA1C  CBG: Recent Labs  Lab 22-Feb-2018 0239 02-22-2018 0513  GLUCAP 104* 103*    Review of Systems:   unable  Past Medical History  She,  has a past medical history of Alcohol dependence (HCC), Ascites, Cirrhosis (HCC), and GERD (gastroesophageal reflux disease).   Surgical History    Past Surgical History:  Procedure Laterality Date  . ESOPHAGOGASTRODUODENOSCOPY N/A 06/28/2014   Procedure: ESOPHAGOGASTRODUODENOSCOPY (EGD);  Surgeon: Wallace Cullens, MD;  Location[facility] ENDOSCOPY;  Service: Gastroenterology;  Laterality: N/A;     Social History   reports that she has been smoking cigarettes. She has been smoking about 0.50 packs per day. She has never used smokeless tobacco. She reports current alcohol use. She reports that she does not use drugs.   Family History   Her family history is not on file.   Allergies No Known Allergies   Home Medications  Prior to Admission medications   Medication Sig Start Date End  Date Taking? Authorizing Provider  albuterol (PROVENTIL HFA;VENTOLIN HFA) 108 (90 Base) MCG/ACT inhaler Inhale 2 puffs into the lungs every 6 (six) hours as needed for wheezing or shortness of breath. 03/18/16   Menshew, Charlesetta Ivory, PA-C  benzonatate (TESSALON PERLES) 100 MG capsule Take 1 capsule (100 mg total) by mouth 3 (three) times daily as needed for cough (Take 1-2 per dose). 03/18/16   Menshew, Charlesetta Ivory, PA-C  busPIRone (BUSPAR) 7.5 MG tablet Take 7.5 mg by mouth 2 (two) times daily as needed (anxiety).     [provider]  dexlansoprazole (DEXILANT) 60 MG capsule Take 60 mg by mouth daily.    [provider]  dibucaine (NUPERCAINAL) 1 % OINT Place 1 application rectally 3 (three) times daily as needed for pain. 04/17/15   Menshew, Charlesetta Ivory, PA-C  fluticasone (FLONASE) 50 MCG/ACT nasal spray Place 2 sprays into both nostrils daily. 03/18/16   Menshew, Charlesetta Ivory, PA-C  furosemide (LASIX) 40 MG tablet Take 40 mg by mouth daily.     [provider]  Boris Lown Oil 300 MG CAPS Take 350 mg by mouth daily.     [provider]  lactulose (CHRONULAC) 10 GM/15ML solution Take 30 mLs by mouth 3 (three) times daily. 08/05/15   [provider]  nadolol (CORGARD) 20 MG tablet Take 20 mg by mouth daily. 08/05/15 01/23/18  [provider]  ondansetron (ZOFRAN ODT) 4 MG disintegrating tablet Take 1 tablet (4 mg total) by mouth every 8 (eight) hours as needed for nausea or vomiting. 03/18/16   Menshew, Charlesetta Ivory, PA-C  pantoprazole (PROTONIX) 40 MG tablet Take 40 mg by mouth 2 (two) times daily. 08/05/15   [provider]  potassium chloride (K-DUR) 10 MEQ tablet Take 10 mEq by mouth daily. 08/05/15 01/23/18  [provider]  Prenatal Multivit-Min-Fe-FA (PRE-NATAL PO) Take 1 tablet by mouth daily.    [provider]  ranitidine (ZANTAC) 300 MG tablet Take 300 mg by mouth at bedtime. 08/05/15 01/23/18  [provider]  sertraline (ZOLOFT) 25 MG tablet Take 25 mg by mouth 2 (two) times daily.     [provider]  spironolactone (ALDACTONE) 50 MG tablet Take 50 mg by mouth daily.    [provider]  sulfamethoxazole-trimethoprim (BACTRIM DS,SEPTRA DS) 800-160 MG per tablet Take 1 tablet by mouth 2 (two) times daily as needed (cyists). X 3 days    [provider]  traMADol (ULTRAM) 50  MG tablet Take 50 mg by mouth every 6 (six) hours as needed. 08/23/15   [provider]  valACYclovir (VALTREX) 1000 MG tablet Take 1,000 mg by mouth daily.     [provider]     Critical care time: 45 mins     Joneen RoachPaul Marshawn Ninneman, AGACNP-BC [facility] Pulmonary/Critical Care Pager [phone] or [phone]  01/02/2018 8:11 PM

## 2018-01-24 NOTE — Progress Notes (Signed)
[facility] departing w/ this patient at this time.

## 2018-01-24 NOTE — ED Notes (Signed)
ED TO INPATIENT HANDOFF REPORT  Name/Age/Gender Janet Weiss 32 y.o. female  Code Status   Home/SNF/Other Home  Chief Complaint abdominal pain and swelling  Level of Care/Admitting Diagnosis ED Disposition    ED Disposition Condition Comment   Admit  Hospital Area: [facility] [[id number]]  Level of Care: Stepdown [14]  Diagnosis: Alcoholic ketoacidosis [172225]  Admitting Physician: Arnaldo Natal [[id number]]  Attending Physician: Arnaldo Natal [[id number]]  Estimated length of stay: past midnight tomorrow  Certification:: I certify this patient will need inpatient services for at least 2 midnights  PT Class (Do Not Modify): Inpatient [101]  PT Acc Code (Do Not Modify): Private [1]       Medical History Past Medical History:  Diagnosis Date  . Alcohol dependence (HCC)   . Ascites   . Cirrhosis (HCC)   . GERD (gastroesophageal reflux disease)     Allergies No Known Allergies  IV Location/Drains/Wounds Patient Lines/Drains/Airways Status   Active Line/Drains/Airways    Name:   Placement date:   Placement time:   Site:   Days:   Peripheral IV 01/23/18 Left Antecubital   01/23/18    2229    Antecubital   1   Peripheral IV 01/18/2018 Right Forearm   12/30/2017    0053    Forearm   less than 1          Labs/Imaging Results for orders placed or performed during the hospital encounter of 01/23/18 (from the past 48 hour(s))  Pregnancy, urine POC     Status: None   Collection Time: 01/23/18 10:12 PM  Result Value Ref Range   Preg Test, Ur NEGATIVE NEGATIVE    Comment:        THE SENSITIVITY OF THIS METHODOLOGY IS >24 mIU/mL   Lipase, blood     Status: None   Collection Time: 01/23/18 10:33 PM  Result Value Ref Range   Lipase 48 11 - 51 U/L    Comment: Performed at [facility], [address] [place], [place] [address]  Comprehensive metabolic panel     Status: Abnormal   Collection Time: 01/23/18 10:33 PM  Result Value  Ref Range   Sodium 137 135 - 145 mmol/L   Potassium 2.7 (LL) 3.5 - 5.1 mmol/L    Comment: CRITICAL RESULT CALLED TO, READ BACK BY AND VERIFIED WITH Pandora Mccrackin ON 01/23/18 AT [facility]    Chloride 108 98 - 111 mmol/L   CO2 11 (L) 22 - 32 mmol/L   Glucose, Bld 104 (H) 70 - 99 mg/dL   BUN <5 (L) 6 - 20 mg/dL   Creatinine, Ser 8.29 (H) 0.44 - 1.00 mg/dL   Calcium 8.3 (L) 8.9 - 10.3 mg/dL   Total Protein 7.9 6.5 - 8.1 g/dL   Albumin 2.4 (L) 3.5 - 5.0 g/dL   AST 562 (H) 15 - 41 U/L   ALT 36 0 - 44 U/L   Alkaline Phosphatase 174 (H) 38 - 126 U/L   Total Bilirubin 5.9 (H) 0.3 - 1.2 mg/dL   GFR calc non Af Amer 42 (L) >60 mL/min   GFR calc Af Amer 49 (L) >60 mL/min   Anion gap 18 (H) 5 - 15    Comment: Performed at [facility], [address] [place], [place] [address]  CBC     Status: Abnormal   Collection Time: 01/23/18 10:33 PM  Result Value Ref Range   WBC 5.1 4.0 - 10.5 K/uL  RBC 2.92 (L) 3.87 - 5.11 MIL/uL   Hemoglobin 9.5 (L) 12.0 - 15.0 g/dL   HCT 16.126.0 (L) 09.636.0 - 04.546.0 %   MCV 89.0 80.0 - 100.0 fL   MCH 32.5 26.0 - 34.0 pg   MCHC 36.5 (H) 30.0 - 36.0 g/dL   RDW 40.920.5 (H) 81.111.5 - 91.415.5 %   Platelets 53 (L) 150 - 400 K/uL    Comment: Immature Platelet Fraction may be clinically indicated, consider ordering this additional test [id number]    nRBC 0.6 (H) 0.0 - 0.2 %    Comment: Performed at [facility], [address] [place], [place] [address]  Urinalysis, Complete w Microscopic     Status: Abnormal   Collection Time: 01/23/18 10:33 PM  Result Value Ref Range   Color, Urine YELLOW (A) YELLOW   APPearance CLOUDY (A) CLEAR   Specific Gravity, Urine 1.010 1.005 - 1.030   pH 5.0 5.0 - 8.0   Glucose, UA NEGATIVE NEGATIVE mg/dL   Hgb urine dipstick SMALL (A) NEGATIVE   Bilirubin Urine NEGATIVE NEGATIVE   Ketones, ur NEGATIVE NEGATIVE mg/dL   Protein, ur NEGATIVE NEGATIVE mg/dL   Nitrite NEGATIVE NEGATIVE   Leukocytes, UA NEGATIVE NEGATIVE   RBC / HPF  0-5 0 - 5 RBC/hpf   WBC, UA 0-5 0 - 5 WBC/hpf   Bacteria, UA RARE (A) NONE SEEN   Squamous Epithelial / LPF 11-20 0 - 5   Mucus PRESENT    Hyaline Casts, UA PRESENT     Comment: Performed at [facility], [address] [place], [place] [address]  Ethanol     Status: Abnormal   Collection Time: 01/23/18 10:33 PM  Result Value Ref Range   Alcohol, Ethyl (B) 160 (H) <10 mg/dL    Comment: (NOTE) Lowest detectable limit for serum alcohol is 10 mg/dL. For medical purposes only. Performed at [facility], [address] [place], [place] [address]   Beta-hydroxybutyric acid     Status: Abnormal   Collection Time: 01/23/18 10:33 PM  Result Value Ref Range   Beta-Hydroxybutyric Acid 0.34 (H) 0.05 - 0.27 mmol/L    Comment: Performed at [facility], [address] [place], [place] [address]  Troponin I - ONCE - STAT     Status: None   Collection Time: 01/23/18 10:46 PM  Result Value Ref Range   Troponin I <0.03 <0.03 ng/mL    Comment: Performed at [facility], [address] [place], [place] [address]  Ammonia     Status: Abnormal   Collection Time: 01/23/18 11:07 PM  Result Value Ref Range   Ammonia 36 (H) 9 - 35 umol/L    Comment: Performed at [facility], [address] [place], [place] [address]  Lactic acid, plasma     Status: Abnormal   Collection Time: 01/23/18 11:07 PM  Result Value Ref Range   Lactic Acid, Venous 9.7 (HH) 0.5 - 1.9 mmol/L    Comment: CRITICAL RESULT CALLED TO, READ BACK BY AND VERIFIED WITH Navie Lamoreaux AT 2343 ON 01/23/18 [facility]. Performed at [facility], [address] [place], [place] [address]   Glucose, capillary     Status: Abnormal   Collection Time: 01/20/2018  2:39 AM  Result Value Ref Range   Glucose-Capillary 104 (H) 70 - 99 mg/dL   Dg Chest Portable 1 View  Result Date: 01/01/2018 CLINICAL DATA:  Altered mental status tachycardia EXAM: PORTABLE CHEST 1 VIEW COMPARISON:  None.  FINDINGS: Normal heart size. Normal mediastinal contour. No pneumothorax. No pleural effusion. Low lung volumes. No pulmonary edema. No acute consolidative airspace disease. IMPRESSION: Low lung volumes with no active cardiopulmonary disease. Electronically Signed   By: Delbert PhenixJason A Poff M.D.   On: 01/10/2018 02:58    Pending Labs Unresulted Labs (From admission, onward)    Start     Ordered   01/23/18 2355  Hepatitis panel, acute  Add-on,   AD     01/23/18 2355   01/23/18 2302  Lactic acid, plasma  Now then every 2 hours,   STAT     01/23/18 2301   Signed and Held  HIV Antibody (routine testing w rflx)  Add-on,   R     Signed and Held   Signed and Held  TSH  Add-on,   R     Signed and Held          Vitals/Pain Today's Vitals   01/23/18 2340 12/30/2017 0000 01/21/2018 0210 01/16/2018 0215  BP: 134/82 113/68 (!) 128/113 (!) 128/113  Pulse: (!) 110 (!) 113 (!) 135 (!) 128  Resp:  (!) 23  (!) 30  Temp:      TempSrc:      SpO2:  99%  99%  Weight:      Height:      PainSc:        Isolation Precautions No active isolations  Medications Medications  potassium chloride SA (K-DUR,KLOR-CON) CR tablet 40 mEq (0 mEq Oral Hold 01/16/2018 0020)  potassium chloride 10 mEq in 100 mL IVPB (10 mEq Intravenous New Bag/Given 01/03/2018 0234)  folic acid (FOLVITE) tablet 1 mg (0 mg Oral Hold 01/23/2018 0021)  multivitamin with minerals tablet 1 tablet (0 tablets Oral Hold 01/11/2018 0021)  dextrose 5 %-0.9 % sodium chloride infusion ( Intravenous New Bag/Given 01/12/2018 0249)  thiamine (B-1) injection 100 mg (100 mg Intravenous Given 01/23/18 2347)  LORazepam (ATIVAN) injection 2 mg (2 mg Intravenous Given 01/23/18 2347)  sodium chloride 0.9 % bolus 1,000 mL (0 mLs Intravenous Stopped 01/15/2018 0229)  LORazepam (ATIVAN) injection 2 mg (2 mg Intravenous Given 01/09/2018 0213)    Mobility non-ambulatory

## 2018-01-24 NOTE — Discharge Summary (Addendum)
Physician Discharge Summary  Patient ID: Janet Weiss MRN: [id number] DOB/AGE: October 08, 1985 32 y.o.  Admit date: 01/23/2018 Discharge date: 01/20/2018  Admission DiagVassie Lollnoses: Altered mental status/respiratory failure/alcoholic ketoacidosis  Discharge Diagnoses:  Respiratory failure requiring mechanical ventilation New intracranial hemorrhage with hydrocephalus requiring possible neurosurgical intervention Alcoholism Cirrhosis Alcoholic ketoacidosis Ascites  Discharged Condition: Critical  Hospital Course: [name] is a 32 year old African-American female with a past medical history remarkable for alcoholic dependency, cirrhosis, gastroesophageal reflux disease, ascites, Presented to the emergency department, brought by a friend, appeared to be confused, speaking incoherently, shortness of breath and abdominal pain.  She was noted to have a lactic acid of 9.7 hemoglobin of 9.5, alcohol level of 160, anion gap of 18, CO2 of 11, calcium of 2.7.  She developed respiratory failure and required intubation and is presently in the intensive care unit.  When I saw patient in consultation this morning she had required a dose of Nimbex secondary to severe agitation.  Urine drug screen was performed along with salicylates and acetaminophen, patient was noted to have elevated alcohol level.  Also was noted to have an INR of 2.24.  Because of her mental status changes and elevated INR I obtained a CT scan of her head this morning which revealed an intracranial hemorrhage most likely beginning right thalamic with ventricular obstruction.  Stat consultation with neurology, patient received mannitol, vitamin K, fresh frozen plasma, platelets ordered, may not be ready prior to transfer, will notify [facility] if that is the case.  [facility] by Dr. Amada JupiterKirkpatrick.  Dr. Thad Rangereynolds has updated family who understands and is agreeable to transfer  Addendum: Patient's hemodynamic status  is marginal, will place central line Blood cultures already positive for gram-negative rods.  Changing antibiotics to meropenem Arterial blood gas shows alkalosis at pH 7.44 will leave on present settings  Consults: Neurology  Significant Diagnostic Studies: CT scan of the head  Treatments: Mechanical ventilation, Rocephin, Flagyl, vitamin K, FFP electrolyte replacement  Discharge Exam: Blood pressure (!) 93/55, pulse (!) 103, temperature (!) 97.4 F (36.3 C), temperature source Axillary, resp. rate (!) 26, height 5\' 2"  (1.575 m), weight 59.5 kg, SpO2 100 %. HEENT:           Patient is orally intubated, orogastric tube in place, trachea midline, no thyromegaly noted, no jugular venous distention appreciated.  Pupils asymmetric and on equal which is a new exam finding from this morning Cardiovascular:           Tachycardia appreciated, sinus mechanism Pulmonary:      Mild expiratory rhonchi appreciated Abdominal:      Some distention with ascites noted, bowel sounds are positive but hypoactive Extremities:     No clubbing, cyanosis or edema noted Neurologic:       Unresponsive, no spontaneous movement, pupils unequal and unresponsive.    Disposition:  [facility] for neurology/neurosurgical intervention.  Arranged by Dr. Thad Rangereynolds, accepting physician Dr. Sofie HartiganKirkpatrick  Shanteria Laye, DO  Signed: Tora KindredONFORTI,Elzabeth Mcquerry 01/12/2018, 3:33 PM

## 2018-01-24 NOTE — Progress Notes (Signed)
Janet Weiss ID: Janet Weiss, female   DOB: [birth year], 32 y.o.   MRN: [id number] Pulmonary/critical care attending  CT scan of the head performed reveals probable thalamic bleed with intraventricular extension and hydrocephalus.  Consulting neurology ASAP will most likely require transfer.  Will reverse INR and give 4 units of FFP along with vitamin K.  We will keep Janet Weiss on mechanical ventilation at this time  Tora KindredJohn Flordia Kassem, DO

## 2018-01-24 NOTE — Consult Note (Addendum)
Reason for Consult: Respiratory failure requiring intubation Referring Physician: Hospitalist service  Janet Weiss is an 32 y.o. female.  HPI: Janet Weiss is a 32 year old African-American female with a past medical history remarkable for alcoholic dependency, cirrhosis, gastroesophageal reflux disease, ascites, Presented to the emergency department, brought by a friend, appeared to be confused, speaking incoherently, shortness of breath and abdominal pain.  She was noted to have a lactic acid of 9.7 hemoglobin of 9.5, alcohol level of 160, anion gap of 18, CO2 of 11, calcium of 2.7.  She developed aggressive respiratory failure and required intubation and is presently in the intensive care unit.  She required a dose of Nimbex secondary to severe agitation this morning  Past Medical History:  Diagnosis Date  . Alcohol dependence (HCC)   . Ascites   . Cirrhosis (HCC)   . GERD (gastroesophageal reflux disease)     Past Surgical History:  Procedure Laterality Date  . ESOPHAGOGASTRODUODENOSCOPY N/A 06/28/2014   Procedure: ESOPHAGOGASTRODUODENOSCOPY (EGD);  Surgeon: Wallace Cullens, MD;  Location: [facility] ENDOSCOPY;  Service: Gastroenterology;  Laterality: N/A;    History reviewed. No pertinent family history.  Social History:  reports that she has been smoking cigarettes. She has been smoking about 0.50 packs per day. She has never used smokeless tobacco. She reports current alcohol use. She reports that she does not use drugs.  Allergies: No Known Allergies  Medications: I have reviewed the patient's current medications.  Results for orders placed or performed during the hospital encounter of 01/23/18 (from the past 48 hour(s))  Pregnancy, urine POC     Status: None   Collection Time: 01/23/18 10:12 PM  Result Value Ref Range   Preg Test, Ur NEGATIVE NEGATIVE    Comment:        THE SENSITIVITY OF THIS METHODOLOGY IS >24 mIU/mL   Lipase, blood     Status: None   Collection Time:  01/23/18 10:33 PM  Result Value Ref Range   Lipase 48 11 - 51 U/L    Comment: Performed at [facility], [address] [place], [place] [address]  Comprehensive metabolic panel     Status: Abnormal   Collection Time: 01/23/18 10:33 PM  Result Value Ref Range   Sodium 137 135 - 145 mmol/L   Potassium 2.7 (LL) 3.5 - 5.1 mmol/L    Comment: CRITICAL RESULT CALLED TO, READ BACK BY AND VERIFIED WITH REBECCA LYNN ON 01/23/18 AT 2328 [facility]    Chloride 108 98 - 111 mmol/L   CO2 11 (L) 22 - 32 mmol/L   Glucose, Bld 104 (H) 70 - 99 mg/dL   BUN <5 (L) 6 - 20 mg/dL   Creatinine, Ser 6.04 (H) 0.44 - 1.00 mg/dL   Calcium 8.3 (L) 8.9 - 10.3 mg/dL   Total Protein 7.9 6.5 - 8.1 g/dL   Albumin 2.4 (L) 3.5 - 5.0 g/dL   AST 540 (H) 15 - 41 U/L   ALT 36 0 - 44 U/L   Alkaline Phosphatase 174 (H) 38 - 126 U/L   Total Bilirubin 5.9 (H) 0.3 - 1.2 mg/dL   GFR calc non Af Amer 42 (L) >60 mL/min   GFR calc Af Amer 49 (L) >60 mL/min   Anion gap 18 (H) 5 - 15    Comment: Performed at [facility], [address] [place], [place] [address]  CBC     Status: Abnormal   Collection Time: 01/23/18 10:33 PM  Result Value Ref Range  WBC 5.1 4.0 - 10.5 K/uL   RBC 2.92 (L) 3.87 - 5.11 MIL/uL   Hemoglobin 9.5 (L) 12.0 - 15.0 g/dL   HCT 29.5 (L) 62.1 - 30.8 %   MCV 89.0 80.0 - 100.0 fL   MCH 32.5 26.0 - 34.0 pg   MCHC 36.5 (H) 30.0 - 36.0 g/dL   RDW 65.7 (H) 84.6 - 96.2 %   Platelets 53 (L) 150 - 400 K/uL    Comment: Immature Platelet Fraction may be clinically indicated, consider ordering this additional test [id number]    nRBC 0.6 (H) 0.0 - 0.2 %    Comment: Performed at [facility], [address] [place], [place] [address]  Urinalysis, Complete w Microscopic     Status: Abnormal   Collection Time: 01/23/18 10:33 PM  Result Value Ref Range   Color, Urine YELLOW (A) YELLOW   APPearance CLOUDY (A) CLEAR   Specific Gravity, Urine 1.010 1.005 - 1.030   pH 5.0 5.0 - 8.0    Glucose, UA NEGATIVE NEGATIVE mg/dL   Hgb urine dipstick SMALL (A) NEGATIVE   Bilirubin Urine NEGATIVE NEGATIVE   Ketones, ur NEGATIVE NEGATIVE mg/dL   Protein, ur NEGATIVE NEGATIVE mg/dL   Nitrite NEGATIVE NEGATIVE   Leukocytes, UA NEGATIVE NEGATIVE   RBC / HPF 0-5 0 - 5 RBC/hpf   WBC, UA 0-5 0 - 5 WBC/hpf   Bacteria, UA RARE (A) NONE SEEN   Squamous Epithelial / LPF 11-20 0 - 5   Mucus PRESENT    Hyaline Casts, UA PRESENT     Comment: Performed at [facility], [address], [place], [place] [address]  Ethanol     Status: Abnormal   Collection Time: 01/23/18 10:33 PM  Result Value Ref Range   Alcohol, Ethyl (B) 160 (H) <10 mg/dL    Comment: (NOTE) Lowest detectable limit for serum alcohol is 10 mg/dL. For medical purposes only. Performed at [facility], [address] [place], [place] [address]   Beta-hydroxybutyric acid     Status: Abnormal   Collection Time: 01/23/18 10:33 PM  Result Value Ref Range   Beta-Hydroxybutyric Acid 0.34 (H) 0.05 - 0.27 mmol/L    Comment: Performed at [facility], [address] [place], [place] [address]  Troponin I - ONCE - STAT     Status: None   Collection Time: 01/23/18 10:46 PM  Result Value Ref Range   Troponin I <0.03 <0.03 ng/mL    Comment: Performed at [facility], [address] [place], [place] [address]  Magnesium     Status: Abnormal   Collection Time: 01/23/18 10:46 PM  Result Value Ref Range   Magnesium 1.1 (L) 1.7 - 2.4 mg/dL    Comment: Performed at [facility], [address] [place], [place] [address]  TSH     Status: None   Collection Time: 01/23/18 10:46 PM  Result Value Ref Range   TSH 0.656 0.350 - 4.500 uIU/mL    Comment: Performed by a 3rd Generation assay with a functional sensitivity of <=0.01 uIU/mL. Performed at [facility], [address] [place], [place] [address]   Ammonia     Status: Abnormal   Collection Time: 01/23/18 11:07 PM   Result Value Ref Range   Ammonia 36 (H) 9 - 35 umol/L    Comment: Performed at [facility], [address] [place], [place] [address]  Lactic acid, plasma     Status: Abnormal   Collection  Time: 01/23/18 11:07 PM  Result Value Ref Range   Lactic Acid, Venous 9.7 (HH) 0.5 - 1.9 mmol/L    Comment: CRITICAL RESULT CALLED TO, READ BACK BY AND VERIFIED WITH REBECCA LYNN AT 2343 ON 01/23/18 [facility]. Performed at [facility], [address] [place], [place] [address]   Lactic acid, plasma     Status: Abnormal   Collection Time: 03-25-17  2:37 AM  Result Value Ref Range   Lactic Acid, Venous 12.8 (HH) 0.5 - 1.9 mmol/L    Comment: RESULT CONFIRMED BY MANUAL DILUTION SRC CRITICAL RESULT CALLED TO, READ BACK BY AND VERIFIED WITH REBECCA LYNN ON 03-25-17 AT 0327 [facility] Performed at [facility], [address] [place], [place] [address]   Glucose, capillary     Status: Abnormal   Collection Time: 03-25-17  2:39 AM  Result Value Ref Range   Glucose-Capillary 104 (H) 70 - 99 mg/dL  Blood gas, arterial     Status: Abnormal   Collection Time: 03-25-17  3:50 AM  Result Value Ref Range   FIO2 35.00    Delivery systems VENTILATOR    Mode ASSIST CONTROL    VT 420 mL   Peep/cpap 5.0 cm H20   pH, Arterial 7.18 (LL) 7.350 - 7.450    Comment: CRITICAL RESULT, NOTIF[facility] PHYSICIAN [facility] [id number] 0410 DT    pCO2 arterial <19.0 (LL) 32.0 - 48.0 mmHg    Comment: CRITICAL RESULT, NOTIFIED PHYSICIAN DR [facility] [id number] 0410DT    pO2, Arterial 187 (H) 83.0 - 108.0 mmHg   Bicarbonate 6.3 (L) 20.0 - 28.0 mmol/L   Acid-base deficit 19.8 (H) 0.0 - 2.0 mmol/L   O2 Saturation 99.4 %   Patient temperature 37.0    Collection site RIGHT BRACHIAL    Sample type ARTERIAL DRAW    Allens test (pass/fail) PASS PASS   Mechanical Rate 30     Comment: Performed at [facility], [address] [place], [place] [address]  Blood gas, arterial     Status: Abnormal    Collection Time: 03-25-17  5:09 AM  Result Value Ref Range   FIO2 0.35    Delivery systems VENTILATOR    Mode PRESSURE REGULATED VOLUME CONTROL    VT 420 mL   LHR 30 resp/min   Peep/cpap 5.0 cm H20   pH, Arterial 7.18 (LL) 7.350 - 7.450    Comment: CRITICAL RESULT CALLED TO, READ BACK BY AND VERIFIED WITH: AT 0550 [id number] TO RUST CHESTER RN BY SD    pCO2 arterial 20 (L) 32.0 - 48.0 mmHg   pO2, Arterial 166 (H) 83.0 - 108.0 mmHg   Bicarbonate 7.5 (L) 20.0 - 28.0 mmol/L   Acid-base deficit 18.8 (H) 0.0 - 2.0 mmol/L   O2 Saturation 99.1 %   Patient temperature 37.0    Collection site RIGHT RADIAL    Sample type ARTERIAL DRAW    Allens test (pass/fail) PASS PASS   Mechanical Rate 30     Comment: Performed at [facility], [address] [place], [place] [address]  Glucose, capillary     Status: Abnormal   Collection Time: 03-25-17  5:13 AM  Result Value Ref Range   Glucose-Capillary 103 (H) 70 - 99 mg/dL   Comment 1 Notify RN    Comment 2 Document in Chart   Triglycerides     Status: Abnormal   Collection Time: 03-25-17  5:16 AM  Result Value Ref Range   Triglycerides 602 (H) <150 mg/dL  Comment: Performed at [facility], [address] [place], [place] [address]  CBC     Status: Abnormal   Collection Time: 05-08-17  5:16 AM  Result Value Ref Range   WBC 9.3 4.0 - 10.5 K/uL   RBC 2.51 (L) 3.87 - 5.11 MIL/uL   Hemoglobin 8.5 (L) 12.0 - 15.0 g/dL   HCT 60.423.0 (L) 54.036.0 - 98.146.0 %   MCV 91.6 80.0 - 100.0 fL   MCH 33.9 26.0 - 34.0 pg   MCHC 37.0 (H) 30.0 - 36.0 g/dL   RDW 19.121.0 (H) 47.811.5 - 29.515.5 %   Platelets 53 (L) 150 - 400 K/uL    Comment: Immature Platelet Fraction may be clinically indicated, consider ordering this additional test [id number]    nRBC 0.5 (H) 0.0 - 0.2 %    Comment: Performed at [facility], [address] [place], [place] [address]  Comprehensive metabolic panel     Status: Abnormal   Collection Time: 05-08-17  5:16 AM   Result Value Ref Range   Sodium 136 135 - 145 mmol/L   Potassium 3.0 (L) 3.5 - 5.1 mmol/L   Chloride 109 98 - 111 mmol/L   CO2 8 (L) 22 - 32 mmol/L   Glucose, Bld 109 (H) 70 - 99 mg/dL   BUN <5 (L) 6 - 20 mg/dL   Creatinine, Ser 6.291.32 (H) 0.44 - 1.00 mg/dL   Calcium 7.5 (L) 8.9 - 10.3 mg/dL   Total Protein 6.8 6.5 - 8.1 g/dL   Albumin 2.0 (L) 3.5 - 5.0 g/dL   AST 528152 (H) 15 - 41 U/L   ALT 30 0 - 44 U/L   Alkaline Phosphatase 130 (H) 38 - 126 U/L   Total Bilirubin 5.8 (H) 0.3 - 1.2 mg/dL   GFR calc non Af Amer 53 (L) >60 mL/min   GFR calc Af Amer >60 >60 mL/min   Anion gap 19 (H) 5 - 15    Comment: Performed at [facility], [address] [place], [place] [address]  Protime-INR     Status: Abnormal   Collection Time: 05-08-17  5:16 AM  Result Value Ref Range   Prothrombin Time 24.5 (H) 11.4 - 15.2 seconds   INR 2.24     Comment: Performed at [facility], [address] [place], [place] [address]  Lactic acid, plasma     Status: Abnormal   Collection Time: 05-08-17  5:16 AM  Result Value Ref Range   Lactic Acid, Venous 12.5 (HH) 0.5 - 1.9 mmol/L    Comment: CRITICAL RESULT CALLED TO, READ BACK BY AND VERIFIED WITH Thurston HoleANNE [facility] 05-08-17 0615 K[facility] RESULTS CONFIRMED BY MANUAL DILUTION Performed at [facility], [address] [place], [place] [address] CORRECTED ON 12/30 AT 0617: PREVIOUSLY REPORTED AS 12.5 CRITICAL RESULT CALLED TO, READ BACK BY AND VERIFIED WITH ANNE [facility] 05-08-17 0615 K[facility]   Magnesium     Status: Abnormal   Collection Time: 05-08-17  5:16 AM  Result Value Ref Range   Magnesium 1.1 (L) 1.7 - 2.4 mg/dL    Comment: Performed at [facility], [address] [place], [place] [address]  Phosphorus     Status: None   Collection Time: 05-08-17  5:16 AM  Result Value Ref Range   Phosphorus 4.5 2.5 - 4.6 mg/dL    Comment: Performed at [facility], [address] [place], [place] [address]  MRSA PCR  Screening     Status: None  Collection Time: 01/22/2018  5:31 AM  Result Value Ref Range   MRSA by PCR NEGATIVE NEGATIVE    Comment:        The GeneXpert MRSA Assay (FDA approved for NASAL specimens only), is one component of a comprehensive MRSA colonization surveillance program. It is not intended to diagnose MRSA infection nor to guide or monitor treatment for MRSA infections. Performed at [facility], [address] [place], [place] [address]   Lactic acid, plasma     Status: Abnormal   Collection Time: 01/03/2018  8:02 AM  Result Value Ref Range   Lactic Acid, Venous 10.7 (HH) 0.5 - 1.9 mmol/L    Comment: CRITICAL RESULT CALLED TO, READ BACK BY AND VERIFIED WITH Riviera FOSTER AT 6045 12/29/2017 DAS Performed at [facility], [address] [place], [place] [address]     Dg Chest Port 1 View  Result Date: 01/15/2018 CLINICAL DATA:  Intubation EXAM: PORTABLE CHEST 1 VIEW COMPARISON:  01/15/2018 FINDINGS: Endotracheal tube tip is at the level of the clavicular heads. Side port of the nasogastric tube projects over the stomach. The heart size and mediastinal contours are within normal limits. Both lungs are clear. The visualized skeletal structures are unremarkable. IMPRESSION: ET tube tip at the level of the clavicular heads. Electronically Signed   By: Deatra Robinson M.D.   On: 12/29/2017 04:46   Dg Chest Portable 1 View  Result Date: 12/30/2017 CLINICAL DATA:  Altered mental status tachycardia EXAM: PORTABLE CHEST 1 VIEW COMPARISON:  None. FINDINGS: Normal heart size. Normal mediastinal contour. No pneumothorax. No pleural effusion. Low lung volumes. No pulmonary edema. No acute consolidative airspace disease. IMPRESSION: Low lung volumes with no active cardiopulmonary disease. Electronically Signed   By: Delbert Phenix M.D.   On: 01/14/2018 02:58    ROS  Unable to obtain  Blood pressure (!) 106/53, pulse (!) 125, temperature 97.6 F (36.4 C), temperature  source Axillary, resp. rate (!) 26, height 5\' 2"  (1.575 m), weight 59.5 kg, SpO2 100 %. Physical Exam Patient is status post Nimbex secondary to ventilator patient dyssynchrony this morning.  Presently sedated on fentanyl with as needed benzodiazepine.  She is intubated on mechanical ventilation HEENT: Patient is orally intubated, orogastric tube in place, trachea midline, no thyromegaly noted, no jugular venous distention appreciated Cardiovascular: Tachycardia appreciated, sinus mechanism Pulmonary: Mild expiratory rhonchi appreciated Abdominal: Some distention with ascites noted, bowel sounds are positive but hypoactive Extremities: No clubbing, cyanosis or edema noted Neurologic:  limited exam secondary to paralytic and sedation.  Assessment/Plan:  Altered mental status.  Multiple etiologies to include encephalopathy secondary to alcohol, cirrhosis, elevated ammonia level.  Patient also with elevated INR, will obtain CT scan of head.  We will support on mechanical ventilation.  Will complete urine drug screen analysis along with acetaminophen and aspirin level.  Will continue on lactulose, Protonix  Acid-base derangement.  Patient with elevated anion gap metabolic acidosis with significant lactic acid elevation.  Complete interpretation reveals an anion gap of 19, delta gap of 7, calculated starting bicarb of 15 consistent with both an anion gap and non-anion gap metabolic acidosis, with appropriate respiratory compensation based on Winters formula.  Significant lactic acidosis.  This may be secondary to liver failure as patient also has transaminitis, elevated total bilirubin, AST greater than ALT with hypoalbuminemia.  Will place on empiric broad-spectrum antibiotic coverage.  We will also consult interventional radiology for paracentesis  Severe electrolyte abnormality.  Patient is hypokalemic and hypomagnesemic.  Pharmacy consulted and placed on protocol placement  Cirrhosis.  Will  consult gastroenterology, calculate discriminant factor, consider systemic steroids, evaluate ascites, empiric broad-spectrum antibiotic coverage  Critical care time 40 minutes  Janet Weiss 01/06/2018, 10:44 AM

## 2018-01-24 NOTE — Progress Notes (Signed)
eLink Physician-Brief Progress Note Patient Name: Janet Weiss Cutting DOB: 1985-11-19 MRN: [id number]   Date of Service  01/25/2018  HPI/Events of Note  Triglycerides elevated >600. Propofol held. Patient desynchronized on the vent despite fentanyl  eICU Interventions  Add precedex for sedation     Intervention Category Minor Interventions: Agitation / anxiety - evaluation and management  Darl Pikesmily T Dwane Andres 01/20/2018, 6:45 AM

## 2018-01-24 NOTE — Progress Notes (Signed)
Transported to CT and back with no issues.

## 2018-01-24 NOTE — H&P (Signed)
Janet Weiss is an 32 y.o. female.   Chief Complaint: Shortness of breath HPI: The patient with past medical history of alcoholism and cirrhosis presents to the emergency department incidentally with a friend.  She appeared to be confused and was speaking incoherently which prompted ED nursing to triage the patient who reported shortness of breath and abdominal pain.  Laboratory evaluation revealed elevated ammonia level, hypokalemia and increased anion gap.  Lactic acid level was 9.7 and the patient was very tachypneic.  She received thiamine and dextrose in the emergency department as well as potassium prior to the ED staff calling hospitalist service for admission.  Past Medical History:  Diagnosis Date  . Alcohol dependence (HCC)   . Ascites   . Cirrhosis (HCC)   . GERD (gastroesophageal reflux disease)     Past Surgical History:  Procedure Laterality Date  . ESOPHAGOGASTRODUODENOSCOPY N/A 06/28/2014   Procedure: ESOPHAGOGASTRODUODENOSCOPY (EGD);  Surgeon: Wallace CullensPaul Y Oh, MD;  [facility] END[facility];  Service: Gastroenterology;  Laterality: N/A;    History reviewed. No pertinent family history.  Obtunded by the time of my exam  Social History:  reports that she has been smoking cigarettes. She has been smoking about 0.50 packs per day. She has never used smokeless tobacco. She reports current alcohol use. She reports that she does not use drugs.  Allergies: No Known Allergies  Prior to Admission medications   Medication Sig Start Date End Date Taking? Authorizing Provider  albuterol (PROVENTIL HFA;VENTOLIN HFA) 108 (90 Base) MCG/ACT inhaler Inhale 2 puffs into the lungs every 6 (six) hours as needed for wheezing or shortness of breath. 03/18/16  Yes Menshew, Charlesetta IvoryJenise V Bacon, PA-C  benzonatate (TESSALON PERLES) 100 MG capsule Take 1 capsule (100 mg total) by mouth 3 (three) times daily as needed for cough (Take 1-2 per dose). 03/18/16  Yes Menshew, Charlesetta IvoryJenise V Bacon, PA-C  busPIRone (BUSPAR)  7.5 MG tablet Take 7.5 mg by mouth 2 (two) times daily as needed (anxiety).    Yes [provider]  dexlansoprazole (DEXILANT) 60 MG capsule Take 60 mg by mouth daily.   Yes [provider]  dibucaine (NUPERCAINAL) 1 % OINT Place 1 application rectally 3 (three) times daily as needed for pain. 04/17/15  Yes Menshew, Charlesetta IvoryJenise V Bacon, PA-C  fluticasone (FLONASE) 50 MCG/ACT nasal spray Place 2 sprays into both nostrils daily. 03/18/16  Yes Menshew, Charlesetta IvoryJenise V Bacon, PA-C  furosemide (LASIX) 40 MG tablet Take 40 mg by mouth daily.    Yes [provider]  Boris LownKrill Oil 300 MG CAPS Take 350 mg by mouth daily.    Yes [provider]  lactulose (CHRONULAC) 10 GM/15ML solution Take 30 mLs by mouth 3 (three) times daily. 08/05/15  Yes [provider]  nadolol (CORGARD) 20 MG tablet Take 20 mg by mouth daily. 08/05/15 01/23/18 Yes [provider]  ondansetron (ZOFRAN ODT) 4 MG disintegrating tablet Take 1 tablet (4 mg total) by mouth every 8 (eight) hours as needed for nausea or vomiting. 03/18/16  Yes Menshew, Charlesetta IvoryJenise V Bacon, PA-C  pantoprazole (PROTONIX) 40 MG tablet Take 40 mg by mouth 2 (two) times daily. 08/05/15  Yes [provider]  potassium chloride (K-DUR) 10 MEQ tablet Take 10 mEq by mouth daily. 08/05/15 01/23/18 Yes [provider]  Prenatal Multivit-Min-Fe-FA (PRE-NATAL PO) Take 1 tablet by mouth daily.   Yes [provider]  ranitidine (ZANTAC) 300 MG tablet Take 300 mg by mouth at bedtime. 08/05/15 01/23/18 Yes [provider]  sertraline (ZOLOFT) 25 MG tablet Take 25 mg by mouth 2 (two) times daily.    Yes [provider]  spironolactone (ALDACTONE) 50 MG tablet Take 50 mg by mouth daily.   Yes [provider]  sulfamethoxazole-trimethoprim (BACTRIM DS,SEPTRA DS) 800-160 MG per tablet Take 1 tablet by mouth 2 (two) times daily as needed (cyists). X 3 days   Yes [provider]  traMADol  (ULTRAM) 50 MG tablet Take 50 mg by mouth every 6 (six) hours as needed. 08/23/15  Yes [provider]  valACYclovir (VALTREX) 1000 MG tablet Take 1,000 mg by mouth daily.    Yes [provider]     Results for orders placed or performed during the hospital encounter of 01/23/18 (from the past 48 hour(s))  Pregnancy, urine POC     Status: None   Collection Time: 01/23/18 10:12 PM  Result Value Ref Range   Preg Test, Ur NEGATIVE NEGATIVE    Comment:        THE SENSITIVITY OF THIS METHODOLOGY IS >24 mIU/mL   Lipase, blood     Status: None   Collection Time: 01/23/18 10:33 PM  Result Value Ref Range   Lipase 48 11 - 51 U/L    Comment: Performed at [facility], [address] [place], [place] [address]  Comprehensive metabolic panel     Status: Abnormal   Collection Time: 01/23/18 10:33 PM  Result Value Ref Range   Sodium 137 135 - 145 mmol/L   Potassium 2.7 (LL) 3.5 - 5.1 mmol/L    Comment: CRITICAL RESULT CALLED TO, READ BACK BY AND VERIFIED WITH REBECCA LYNN ON 01/23/18 AT 2328 [facility]    Chloride 108 98 - 111 mmol/L   CO2 11 (L) 22 - 32 mmol/L   Glucose, Bld 104 (H) 70 - 99 mg/dL   BUN <5 (L) 6 - 20 mg/dL   Creatinine, Ser 8.41 (H) 0.44 - 1.00 mg/dL   Calcium 8.3 (L) 8.9 - 10.3 mg/dL   Total Protein 7.9 6.5 - 8.1 g/dL   Albumin 2.4 (L) 3.5 - 5.0 g/dL   AST 324 (H) 15 - 41 U/L   ALT 36 0 - 44 U/L   Alkaline Phosphatase 174 (H) 38 - 126 U/L   Total Bilirubin 5.9 (H) 0.3 - 1.2 mg/dL   GFR calc non Af Amer 42 (L) >60 mL/min   GFR calc Af Amer 49 (L) >60 mL/min   Anion gap 18 (H) 5 - 15    Comment: Performed at [facility], [address] [place], [place] [address]  CBC     Status: Abnormal   Collection Time: 01/23/18 10:33 PM  Result Value Ref Range   WBC 5.1 4.0 - 10.5 K/uL   RBC 2.92 (L) 3.87 - 5.11 MIL/uL   Hemoglobin 9.5 (L) 12.0 - 15.0 g/dL   HCT 72.5 (L) 36.6 - 44.0 %   MCV 89.0 80.0 - 100.0 fL   MCH 32.5 26.0 - 34.0 pg   MCHC  36.5 (H) 30.0 - 36.0 g/dL   RDW 34.7 (H) 42.5 - 95.6 %   Platelets 53 (L) 150 - 400 K/uL    Comment: Immature Platelet Fraction may be clinically indicated, consider ordering this additional test [id number]    nRBC 0.6 (H) 0.0 - 0.2 %    Comment: Performed at [facility], [address] [place], [place] [address]  Urinalysis, Complete w Microscopic     Status: Abnormal   Collection  Time: 01/23/18 10:33 PM  Result Value Ref Range   Color, Urine YELLOW (A) YELLOW   APPearance CLOUDY (A) CLEAR   Specific Gravity, Urine 1.010 1.005 - 1.030   pH 5.0 5.0 - 8.0   Glucose, UA NEGATIVE NEGATIVE mg/dL   Hgb urine dipstick SMALL (A) NEGATIVE   Bilirubin Urine NEGATIVE NEGATIVE   Ketones, ur NEGATIVE NEGATIVE mg/dL   Protein, ur NEGATIVE NEGATIVE mg/dL   Nitrite NEGATIVE NEGATIVE   Leukocytes, UA NEGATIVE NEGATIVE   RBC / HPF 0-5 0 - 5 RBC/hpf   WBC, UA 0-5 0 - 5 WBC/hpf   Bacteria, UA RARE (A) NONE SEEN   Squamous Epithelial / LPF 11-20 0 - 5   Mucus PRESENT    Hyaline Casts, UA PRESENT     Comment: Performed at [facility], [address] [place], [place] [address]  Ethanol     Status: Abnormal   Collection Time: 01/23/18 10:33 PM  Result Value Ref Range   Alcohol, Ethyl (B) 160 (H) <10 mg/dL    Comment: (NOTE) Lowest detectable limit for serum alcohol is 10 mg/dL. For medical purposes only. Performed at [facility], [address] [place], [place] [address]   Beta-hydroxybutyric acid     Status: Abnormal   Collection Time: 01/23/18 10:33 PM  Result Value Ref Range   Beta-Hydroxybutyric Acid 0.34 (H) 0.05 - 0.27 mmol/L    Comment: Performed at [facility], [address] [place], [place] [address]  Troponin I - ONCE - STAT     Status: None   Collection Time: 01/23/18 10:46 PM  Result Value Ref Range   Troponin I <0.03 <0.03 ng/mL    Comment: Performed at [facility], [address] [place], [place] [address]  Ammonia      Status: Abnormal   Collection Time: 01/23/18 11:07 PM  Result Value Ref Range   Ammonia 36 (H) 9 - 35 umol/L    Comment: Performed at [facility], [address] [place], [place] [address]  Lactic acid, plasma     Status: Abnormal   Collection Time: 01/23/18 11:07 PM  Result Value Ref Range   Lactic Acid, Venous 9.7 (HH) 0.5 - 1.9 mmol/L    Comment: CRITICAL RESULT CALLED TO, READ BACK BY AND VERIFIED WITH REBECCA LYNN AT 2343 ON 01/23/18 [facility]. Performed at [facility], [address] [place], [place] [address]   Lactic acid, plasma     Status: Abnormal   Collection Time: 01/05/2018  2:37 AM  Result Value Ref Range   Lactic Acid, Venous 12.8 (HH) 0.5 - 1.9 mmol/L    Comment: RESULT CONFIRMED BY MANUAL [facility]IL[facility] CRITICAL RESULT CALLED TO, READ BACK BY AND VERIFIED WITH REBECCA LYNN ON 01/25/2018 AT 0327 [facility] Performed at [facility], [address] [place], [place] [address]   Glucose, capillary     Status: Abnormal   Collection Time: 01/12/2018  2:39 AM  Result Value Ref Range   Glucose-Capillary 104 (H) 70 - 99 mg/dL   Dg Chest Portable 1 View  Result Date: 01/25/2018 CLINICAL DATA:  Altered mental status tachycardia EXAM: PORTABLE CHEST 1 VIEW COMPARISON:  None. FINDINGS: Normal heart size. Normal mediastinal contour. No pneumothorax. No pleural effusion. Low lung volumes. No pulmonary edema. No acute consolidative airspace disease. IMPRESSION: Low lung volumes with no active cardiopulmonary disease. Electronically Signed   By: Delbert PhenixJason A Poff M.D.   On: 01/17/2018 02:58    Review of Systems  Unable to perform ROS: Medical condition    Blood pressure (!) 128/111, pulse (!) 154, temperature 99.3 F (37.4 C), temperature source Oral, resp. rate (!) 35, height 5\' 2"  (1.575 m), weight 59.9 kg, SpO2 100 %. Physical Exam  Vitals reviewed. Constitutional: She appears well-developed and well-nourished. No distress.  HENT:  Head: Normocephalic and  atraumatic.  Mouth/Throat: Oropharynx is clear and moist.  Eyes: Pupils are equal, round, and reactive to light. Conjunctivae and EOM are normal. No scleral icterus.  Neck: Normal range of motion. Neck supple. No JVD present. No tracheal deviation present. No thyromegaly present.  Cardiovascular: Normal rate, regular rhythm and normal heart sounds. Exam reveals no gallop and no friction rub.  No murmur heard. Respiratory: Breath sounds normal. She is in respiratory distress.  GI: Soft. Bowel sounds are normal. She exhibits distension and ascites. There is no abdominal tenderness.  Genitourinary:    Genitourinary Comments: Deferred   Musculoskeletal: Normal range of motion.        General: No edema.  Lymphadenopathy:    She has no cervical adenopathy.  Neurological: No cranial nerve deficit. She exhibits normal muscle tone.  Skin: Skin is warm and dry. No rash noted. No erythema.     Assessment/Plan This is a 32 year old female admitted for metabolic acidosis. 1.  Metabolic acidosis: Dramatically elevated lactic acid; patient is also an alcoholic.  Alcohol level is elevated and blood sugar essentially is normal.  No indication of infection.  Her clinical condition and presentation seems inconsistent with starvation ketosis and she does not have diabetes.  Will obtain ABG but respiratory rate and tachycardia warrant intubation at this time.  ED staff will initiate mechanical ventilation.  Patient is currently on a dextrose drip but I will switch to normal saline with potassium. 2.  Cirrhosis: With hyperammonemia and ascites; may need paracentesis to allow increase ventilation volumes.  Continue nadolol.  Resume diuretics once acidosis is resolved and potassium repleted. 3.  Transaminitis: Consistent with alcohol abuse 4.  Acute kidney injury: Superimposed on chronic kidney disease.  Avoid nephrotoxic agents.  Hydrate intravenous fluid. 5.  DVT prophylaxis: SCDs as patient has  thrombocytopenia 6.  GI prophylaxis: PPI The patient is a full code.  I have personally spent 45 minutes in critical care time with this patient.  Discussed with [facility] telemedicine   Arnaldo Natal, MD 01/12/2018, 3:38 AM

## 2018-01-24 NOTE — ED Notes (Signed)
MD Lynann BolognaForbach and Diamond made aware of critical lactic acid result.

## 2018-01-24 NOTE — Progress Notes (Addendum)
Patient ID: Janet Weiss, female   DOB: [birth year], 32 y.o.   MRN: [id number] Pulmonary/critical care attending  Procedure note Central line placement  Patient with marginal access, decreasing blood pressure, requiring Neo-Synephrine, also not enough access for fresh frozen plasma.  We will proceed with central line placement emergently  Right femoral location chosen Performed under ultrasound guidance Complete contact barrier precaution utilized Topical Hibiclens Under ultrasound guidance the right femoral vein was accessed on first pass Dark nonpulsatile venous return was noted Guidewire placed and needle removed Using the Seldinger technique a triple-lumen catheter was placed without difficulty The guidewire was removed intact All 3 ports flushed Sutured into place Dressed by nurse  Janet KindredJohn Lilyona Richner, DO

## 2018-01-24 NOTE — Progress Notes (Addendum)
Pharmacy Electrolyte Monitoring Consult:  Pharmacy consulted to assist in monitoring and replacing electrolytes in this 32 y.o. female admitted on 01/23/2018 with Abdominal Pain; Constipation; and Shortness of Breath  Patient is currently intubated and sedated on continuous fentanyl infusion. Patient is also on CIWA protocol and receiving lactulose 20mg  VT TID.   Patietn receiving sodium bicarbonate infusion at 16125mL/hr.   Labs:  Sodium (mmol/L)  Date Value  2017-11-07 136  05/14/2014 137   Potassium (mmol/L)  Date Value  2017-11-07 3.0 (L)  05/14/2014 2.7 (L)   Magnesium (mg/dL)  Date Value  46/96/29522019-10-13 1.1 (L)   Phosphorus (mg/dL)  Date Value  84/13/24402019-10-13 4.5   Calcium (mg/dL)  Date Value  10/27/25362019-10-13 7.5 (L)   Calcium, Total (mg/dL)  Date Value  64/40/347404/18/2016 9.0   Albumin (g/dL)  Date Value  25/95/63872019-10-13 2.0 (L)  05/14/2014 3.2 (L)    Assessment/Plan: Potassium 40mEq PO x 1 and potassium 10mEq IV Q 1hr x 2 doses.   Will check potassium at 2000. Will check all electrolytes with am labs.   Will replace for goal potassium ~4 and goal magnesium ~ 2.   Pharmacy will continue to monitor and adjust per consult.    Janet Weiss L 2017-11-07 2:22 PM

## 2018-01-24 NOTE — Progress Notes (Signed)
PHARMACY - PHYSICIAN COMMUNICATION CRITICAL VALUE ALERT - BLOOD CULTURE IDENTIFICATION (BCID)  Janet Weiss is an 32 y.o. female who [facility] with a chief complaint of alcohol intoxication. Patient now with right thalamic hemorrhage.   Assessment:  Bacteremia, possible aspiration PNA  Name of physician (or Provider) Contacted: [place]  Current antibiotics: Ceftriaxone/Metronidazole   Changes to prescribed antibiotics recommended:  Meropenem 1g IV Q12hr.   Results for orders placed or performed during the hospital encounter of 01/23/18  Blood Culture ID Panel (Reflexed) (Collected: 01-20-18  5:16 AM)  Result Value Ref Range   Enterococcus species NOT DETECTED NOT DETECTED   Listeria monocytogenes NOT DETECTED NOT DETECTED   Staphylococcus species NOT DETECTED NOT DETECTED   Staphylococcus aureus (BCID) NOT DETECTED NOT DETECTED   Streptococcus species NOT DETECTED NOT DETECTED   Streptococcus agalactiae NOT DETECTED NOT DETECTED   Streptococcus pneumoniae NOT DETECTED NOT DETECTED   Streptococcus pyogenes NOT DETECTED NOT DETECTED   Acinetobacter baumannii NOT DETECTED NOT DETECTED   Enterobacteriaceae species DETECTED (A) NOT DETECTED   Enterobacter cloacae complex NOT DETECTED NOT DETECTED   Escherichia coli DETECTED (A) NOT DETECTED   Klebsiella oxytoca NOT DETECTED NOT DETECTED   Klebsiella pneumoniae NOT DETECTED NOT DETECTED   Proteus species NOT DETECTED NOT DETECTED   Serratia marcescens NOT DETECTED NOT DETECTED   Carbapenem resistance NOT DETECTED NOT DETECTED   Haemophilus influenzae NOT DETECTED NOT DETECTED   Neisseria meningitidis NOT DETECTED NOT DETECTED   Pseudomonas aeruginosa NOT DETECTED NOT DETECTED   Candida albicans NOT DETECTED NOT DETECTED   Candida glabrata NOT DETECTED NOT DETECTED   Candida krusei NOT DETECTED NOT DETECTED   Candida parapsilosis NOT DETECTED NOT DETECTED   Candida tropicalis NOT DETECTED NOT  DETECTED    Janet Weiss L 01-20-18  4:21 PM

## 2018-01-24 NOTE — Consult Note (Signed)
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 Ritta Slot  Janet Weiss is an 32 y.o. black female.  HPI: Transferred from [facility] to the 4 N. ICU, on the neurology service of Dr. Ritta Slot.  Patient apparently presented to the [facility] emergency room last night with confusion, speaking incoherently, with shortness of breath and abdominal pain.  Her history was notable for history of alcohol abuse, cirrhosis, and ascites.  She was found to have an elevated ammonia level, hypokalemia, acidosis, and tachypnea.  Patient was admitted to the hospitalist service @ [facility], and seen this morning by Dr. Tora Kindred (critical care medicine), then this afternoon by Dr. Thana Farr (neurology) and Dr. Venetia Night ([facility]).  Patient underwent a CT this afternoon which showed the ICH, IVH, and hydrocephalus.  [facility] declined to accept the patient, and patient was transferred to [facility].  On admission patient required intubation for respiratory failure, and has been treated with a bicarbonate drip for acidosis.  She was found to have a pronounced coagulopathy (INR 2.24) due to her liver disease as well as a pronounced thrombocytopenia (platelet count 53K).  Dr. Amada Jupiter requested neurosurgical consultation at [facility] for consideration of placement of a EVD.  Reviewing her chart we both noted her significant coagulopathy and thrombocytopenia.  I explained that placement of the EVD requires a normal INR of at least 1.4 or less and a platelet count of at least 90K.  She is already been given 2 units of fresh frozen plasma and 2 further units have been ordered.  She has been ordered a unit of platelets.  Dr. Amada Jupiter is going to request repeat labs following completion of these transfusions, and I will be notified if the INR and platelet count have sufficiently  improved.  Patient intubated, comatose, unable to provide history.  Past Medical History:  Past Medical History:  Diagnosis Date  . Alcohol dependence (HCC)   . Ascites   . Cirrhosis (HCC)   . GERD (gastroesophageal reflux disease)     Past Surgical History:  Past Surgical History:  Procedure Laterality Date  . ESOPHAGOGASTRODUODENOSCOPY N/A 06/28/2014   Procedure: ESOPHAGOGASTRODUODENOSCOPY (EGD);  Surgeon: Wallace Cullens, MD;  Location: [facility] ENDOSCOPY;  Service: Gastroenterology;  Laterality: N/A;    Family History: Patient intubated, comatose, unable to provide history.  Social History:  reports that she has been smoking cigarettes. She has been smoking about 0.50 packs per day. She has never used smokeless tobacco. She reports current alcohol use. She reports that she does not use drugs.  Patient intubated, comatose, unable to provide history.  Allergies: No Known Allergies  Medications: I have reviewed the patient's current medications.  ROS:  Patient intubated, comatose, unable to provide history.  Physical Examination: Young black female, intubated, unresponsive to voice. Blood pressure 93/62, pulse 90, temperature (!) 93.6 F (34.2 C), resp. rate (!) 26, SpO2 100 %.  Neurological Examination: Not opening eyes to voice or pain. Pupils: Left 6 mm, oval, nonreactive to light; right 8 mm, round, nonreactive to light. No corneal on the left.  Weak corneal on the right. Doll's eyes examination shows only left 6th nerve function.  No medial deviation of either eye nor lateral deviation of the right eye. Weakly decerebrate bilaterally.   Results for orders placed or performed during the hospital encounter of 01/23/18 (from the past 48 hour(s))  Pregnancy, urine POC     Status: None   Collection Time: 01/23/18  10:12 PM  Result Value Ref Range   Preg Test, Ur NEGATIVE NEGATIVE    Comment:        THE SENSITIVITY OF THIS METHODOLOGY IS >24 mIU/mL   Lipase, blood     Status:  None   Collection Time: 01/23/18 10:33 PM  Result Value Ref Range   Lipase 48 11 - 51 U/L    Comment: Performed at [facility], [address] [place], [place] [address]  Comprehensive metabolic panel     Status: Abnormal   Collection Time: 01/23/18 10:33 PM  Result Value Ref Range   Sodium 137 135 - 145 mmol/L   Potassium 2.7 (LL) 3.5 - 5.1 mmol/L    Comment: CRITICAL RESULT CALLED TO, READ BACK BY AND VERIFIED WITH REBECCA LYNN ON 01/23/18 AT 2328 [facility]    Chloride 108 98 - 111 mmol/L   CO2 11 (L) 22 - 32 mmol/L   Glucose, Bld 104 (H) 70 - 99 mg/dL   BUN <5 (L) 6 - 20 mg/dL   Creatinine, Ser 6.04 (H) 0.44 - 1.00 mg/dL   Calcium 8.3 (L) 8.9 - 10.3 mg/dL   Total Protein 7.9 6.5 - 8.1 g/dL   Albumin 2.4 (L) 3.5 - 5.0 g/dL   AST 540 (H) 15 - 41 U/L   ALT 36 0 - 44 U/L   Alkaline Phosphatase 174 (H) 38 - 126 U/L   Total Bilirubin 5.9 (H) 0.3 - 1.2 mg/dL   GFR calc non Af Amer 42 (L) >60 mL/min   GFR calc Af Amer 49 (L) >60 mL/min   Anion gap 18 (H) 5 - 15    Comment: Performed at [facility], [address] [place], [place] [address]  CBC     Status: Abnormal   Collection Time: 01/23/18 10:33 PM  Result Value Ref Range   WBC 5.1 4.0 - 10.5 K/uL   RBC 2.92 (L) 3.87 - 5.11 MIL/uL   Hemoglobin 9.5 (L) 12.0 - 15.0 g/dL   HCT 14.7 (L) 82.9 - 56.2 %   MCV 89.0 80.0 - 100.0 fL   MCH 32.5 26.0 - 34.0 pg   MCHC 36.5 (H) 30.0 - 36.0 g/dL   RDW 13.0 (H) 86.5 - 78.4 %   Platelets 53 (L) 150 - 400 K/uL    Comment: Immature Platelet Fraction may be clinically indicated, consider ordering this additional test [id number]    nRBC 0.6 (H) 0.0 - 0.2 %    Comment: Performed at [facility], [address] [place], [place] [address]  Urinalysis, Complete w Microscopic     Status: Abnormal   Collection Time: 01/23/18 10:33 PM  Result Value Ref Range   Color, Urine YELLOW (A) YELLOW   APPearance CLOUDY (A) CLEAR   Specific Gravity, Urine 1.010 1.005 - 1.030    pH 5.0 5.0 - 8.0   Glucose, UA NEGATIVE NEGATIVE mg/dL   Hgb urine dipstick SMALL (A) NEGATIVE   Bilirubin Urine NEGATIVE NEGATIVE   Ketones, ur NEGATIVE NEGATIVE mg/dL   Protein, ur NEGATIVE NEGATIVE mg/dL   Nitrite NEGATIVE NEGATIVE   Leukocytes, UA NEGATIVE NEGATIVE   RBC / HPF 0-5 0 - 5 RBC/hpf   WBC, UA 0-5 0 - 5 WBC/hpf   Bacteria, UA RARE (A) NONE SEEN   Squamous Epithelial / LPF 11-20 0 - 5   Mucus PRESENT    Hyaline Casts, UA PRESENT     Comment: Performed at [facility], [address] [place],  [place] [address]  Ethanol     Status: Abnormal   Collection Time: 01/23/18 10:33 PM  Result Value Ref Range   Alcohol, Ethyl (B) 160 (H) <10 mg/dL    Comment: (NOTE) Lowest detectable limit for serum alcohol is 10 mg/dL. For medical purposes only. Performed at [facility], [address] [place], [place] [address]   Beta-hydroxybutyric acid     Status: Abnormal   Collection Time: 01/23/18 10:33 PM  Result Value Ref Range   Beta-Hydroxybutyric Acid 0.34 (H) 0.05 - 0.27 mmol/L    Comment: Performed at [facility], [address] [place], [place] [address]  Troponin I - ONCE - STAT     Status: None   Collection Time: 01/23/18 10:46 PM  Result Value Ref Range   Troponin I <0.03 <0.03 ng/mL    Comment: Performed at [facility], [address] [place], [place] [address]  Magnesium     Status: Abnormal   Collection Time: 01/23/18 10:46 PM  Result Value Ref Range   Magnesium 1.1 (L) 1.7 - 2.4 mg/dL    Comment: Performed at [facility], [address] [place], [place] [address]  TSH     Status: None   Collection Time: 01/23/18 10:46 PM  Result Value Ref Range   TSH 0.656 0.350 - 4.500 uIU/mL    Comment: Performed by a 3rd Generation assay with a functional sensitivity of <=0.01 uIU/mL. Performed at [facility], [address] [place], [place] [address]   Ammonia     Status: Abnormal   Collection Time:  01/23/18 11:07 PM  Result Value Ref Range   Ammonia 36 (H) 9 - 35 umol/L    Comment: Performed at [facility], [address] [place], [place] [address]  Lactic acid, plasma     Status: Abnormal   Collection Time: 01/23/18 11:07 PM  Result Value Ref Range   Lactic Acid, Venous 9.7 (HH) 0.5 - 1.9 mmol/L    Comment: CRITICAL RESULT CALLED TO, READ BACK BY AND VERIFIED WITH REBECCA LYNN AT 2343 ON 01/23/18 [facility]. Performed at [facility], [address] [place], [place] [address]   Lactic acid, plasma     Status: Abnormal   Collection Time: 12/27/2017  2:37 AM  Result Value Ref Range   Lactic Acid, Venous 12.8 (HH) 0.5 - 1.9 mmol/L    Comment: RESULT CONFIRMED BY MANUAL DILUTION SRC CRITICAL RESULT CALLED TO, READ BACK BY AND VERIFIED WITH REBECCA LYNN ON 01/07/2018 AT 0327 [facility] Performed at [facility], [address] [place], [place] [address]   Glucose, capillary     Status: Abnormal   Collection Time: 01/23/2018  2:39 AM  Result Value Ref Range   Glucose-Capillary 104 (H) 70 - 99 mg/dL  Blood gas, arterial     Status: Abnormal   Collection Time: 12/28/2017  3:50 AM  Result Value Ref Range   FIO2 35.00    Delivery systems VENTILATOR    Mode ASSIST CONTROL    VT 420 mL   Peep/cpap 5.0 cm H20   pH, Arterial 7.18 (LL) 7.350 - 7.450    Comment: CRITICAL RESULT, NOTIFIED PHYSICIAN DR [facility] [id number] DT    pCO2 arterial <19.0 (LL) 32.0 - 48.0 mmHg    Comment: CRITICAL RESULT, NOTIFIED PHYSICIAN DR [facility] [id number]    pO2, Arterial 187 (H) 83.0 - 108.0 mmHg   Bicarbonate 6.3 (L) 20.0 - 28.0 mmol/L   Acid-base deficit 19.8 (H)  0.0 - 2.0 mmol/L   O2 Saturation 99.4 %   Patient temperature 37.0    Collection site RIGHT BRACHIAL    Sample type ARTERIAL DRAW    Allens test (pass/fail) PASS PASS   Mechanical Rate 30     Comment: Performed at [facility], [address] [place], [place] [address]  Blood gas, arterial     Status:  Abnormal   Collection Time: Jan 26, 2018  5:09 AM  Result Value Ref Range   FIO2 0.35    Delivery systems VENTILATOR    Mode PRESSURE REGULATED VOLUME CONTROL    VT 420 mL   LHR 30 resp/min   Peep/cpap 5.0 cm H20   pH, Arterial 7.18 (LL) 7.350 - 7.450    Comment: CRITICAL RESULT CALLED TO, READ BACK BY AND VERIFIED WITH: AT [id number] [phone] TO RUST CHESTER RN BY SD    pCO2 arterial 20 (L) 32.0 - 48.0 mmHg   pO2, Arterial 166 (H) 83.0 - 108.0 mmHg   Bicarbonate 7.5 (L) 20.0 - 28.0 mmol/L   Acid-base deficit 18.8 (H) 0.0 - 2.0 mmol/L   O2 Saturation 99.1 %   Patient temperature 37.0    Collection site RIGHT RADIAL    Sample type ARTERIAL DRAW    Allens test (pass/fail) PASS PASS   Mechanical Rate 30     Comment: Performed at [facility], [address] [place], [place] [address]  Glucose, capillary     Status: Abnormal   Collection Time: 01/26/2018  5:13 AM  Result Value Ref Range   Glucose-Capillary 103 (H) 70 - 99 mg/dL   Comment 1 Notify RN    Comment 2 Document in Chart   Triglycerides     Status: Abnormal   Collection Time: 01/26/18  5:16 AM  Result Value Ref Range   Triglycerides 602 (H) <150 mg/dL    Comment: Performed at [facility], [address] [place], [place] [address]  CBC     Status: Abnormal   Collection Time: January 26, 2018  5:16 AM  Result Value Ref Range   WBC 9.3 4.0 - 10.5 K/uL   RBC 2.51 (L) 3.87 - 5.11 MIL/uL   Hemoglobin 8.5 (L) 12.0 - 15.0 g/dL   HCT 62.1 (L) 30.8 - 65.7 %   MCV 91.6 80.0 - 100.0 fL   MCH 33.9 26.0 - 34.0 pg   MCHC 37.0 (H) 30.0 - 36.0 g/dL   RDW 84.6 (H) 96.2 - 95.2 %   Platelets 53 (L) 150 - 400 K/uL    Comment: Immature Platelet Fraction may be clinically indicated, consider ordering this additional test [id number]    nRBC 0.5 (H) 0.0 - 0.2 %    Comment: Performed at [facility], [address] [place], [place] [address]  Comprehensive metabolic panel     Status: Abnormal   Collection Time: 01-26-2018   5:16 AM  Result Value Ref Range   Sodium 136 135 - 145 mmol/L   Potassium 3.0 (L) 3.5 - 5.1 mmol/L   Chloride 109 98 - 111 mmol/L   CO2 8 (L) 22 - 32 mmol/L   Glucose, Bld 109 (H) 70 - 99 mg/dL   BUN <5 (L) 6 - 20 mg/dL   Creatinine, Ser 5.36 (H) 0.44 - 1.00 mg/dL   Calcium 7.5 (L) 8.9 - 10.3 mg/dL   Total Protein 6.8 6.5 - 8.1 g/dL   Albumin 2.0 (L) 3.5 - 5.0 g/dL   AST 644 (H) 15 - 41 U/L  ALT 30 0 - 44 U/L   Alkaline Phosphatase 130 (H) 38 - 126 U/L   Total Bilirubin 5.8 (H) 0.3 - 1.2 mg/dL   GFR calc non Af Amer 53 (L) >60 mL/min   GFR calc Af Amer >60 >60 mL/min   Anion gap 19 (H) 5 - 15    Comment: Performed at [facility], [address] [place], [place] [address]  Protime-INR     Status: Abnormal   Collection Time: 2018-02-18  5:16 AM  Result Value Ref Range   Prothrombin Time 24.5 (H) 11.4 - 15.2 seconds   INR 2.24     Comment: Performed at [facility], [address] [place], [place] [address]  Lactic acid, plasma     Status: Abnormal   Collection Time: 2018-02-18  5:16 AM  Result Value Ref Range   Lactic Acid, Venous 12.5 (HH) 0.5 - 1.9 mmol/L    Comment: CRITICAL RESULT CALLED TO, READ BACK BY AND VERIFIED WITH [facility] 02/18/2018 0615 KBH RESULTS CONFIRMED BY MANUAL DILUTION Performed at [facility], [address] [place], [place] [address] CORRECTED ON 12/30 AT 0617: PREVIOUSLY REPORTED AS 12.5 CRITICAL RESULT CALLED TO, READ BACK BY AND VERIFIED WITH ANNE [facility] Feb 18, 2018 0615 KBH   Culture, blood (Routine X 2) w Reflex to ID Panel     Status: None (Preliminary result)   Collection Time: 2018/02/18  5:16 AM  Result Value Ref Range   Specimen Description BLOOD RIGHT ANTECUBITAL    Special Requests      BOTTLES DRAWN AEROBIC AND ANAEROBIC Blood Culture adequate volume   Culture  Setup Time      Organism ID to follow GRAM NEGATIVE RODS IN BOTH AEROBIC AND ANAEROBIC BOTTLES CRITICAL RESULT CALLED TO, READ BACK BY AND  VERIFIED WITH: Yves Dill 2018-02-18 1616 KLW Performed at [facility], [address] [place], [place] [address]    Culture GRAM NEGATIVE RODS    Report Status PENDING   Magnesium     Status: Abnormal   Collection Time: February 18, 2018  5:16 AM  Result Value Ref Range   Magnesium 1.1 (L) 1.7 - 2.4 mg/dL    Comment: Performed at [facility], [address] [place], [place] [address]  Phosphorus     Status: None   Collection Time: 2018/02/18  5:16 AM  Result Value Ref Range   Phosphorus 4.5 2.5 - 4.6 mg/dL    Comment: Performed at [facility], [address] [place], [place] [address]  Blood Culture ID Panel (Reflexed)     Status: Abnormal   Collection Time: 02-18-2018  5:16 AM  Result Value Ref Range   Enterococcus species NOT DETECTED NOT DETECTED   Listeria monocytogenes NOT DETECTED NOT DETECTED   Staphylococcus species NOT DETECTED NOT DETECTED   Staphylococcus aureus (BCID) NOT DETECTED NOT DETECTED   Streptococcus species NOT DETECTED NOT DETECTED   Streptococcus agalactiae NOT DETECTED NOT DETECTED   Streptococcus pneumoniae NOT DETECTED NOT DETECTED   Streptococcus pyogenes NOT DETECTED NOT DETECTED   Acinetobacter baumannii NOT DETECTED NOT DETECTED   Enterobacteriaceae species DETECTED (A) NOT DETECTED    Comment: Enterobacteriaceae represent a large family of gram-negative bacteria, not a single organism. CRITICAL RESULT CALLED TO, READ BACK BY AND VERIFIED WITH: CHARLES SHANLEVER 2018/02/18 1616 KLW    Enterobacter cloacae complex NOT DETECTED NOT DETECTED   Escherichia coli DETECTED (A) NOT DETECTED    Comment: CRITICAL RESULT CALLED TO, READ BACK BY AND  VERIFIED WITH: Yves DillCHARLES SHANLEVER 12/28/2017 1616 KLW    Klebsiella oxytoca NOT DETECTED NOT DETECTED   Klebsiella pneumoniae NOT DETECTED NOT DETECTED   Proteus species NOT DETECTED NOT DETECTED   Serratia marcescens NOT DETECTED NOT DETECTED   Carbapenem resistance NOT DETECTED NOT  DETECTED   Haemophilus influenzae NOT DETECTED NOT DETECTED   Neisseria meningitidis NOT DETECTED NOT DETECTED   Pseudomonas aeruginosa NOT DETECTED NOT DETECTED   Candida albicans NOT DETECTED NOT DETECTED   Candida glabrata NOT DETECTED NOT DETECTED   Candida krusei NOT DETECTED NOT DETECTED   Candida parapsilosis NOT DETECTED NOT DETECTED   Candida tropicalis NOT DETECTED NOT DETECTED    Comment: Performed at [facility], [address] [place], [place] [address]  Culture, blood (Routine X 2) w Reflex to ID Panel     Status: None (Preliminary result)   Collection Time: 01/05/2018  5:24 AM  Result Value Ref Range   Specimen Description BLOOD RIGHT HAND    Special Requests      BOTTLES DRAWN AEROBIC AND ANAEROBIC Blood Culture adequate volume   Culture  Setup Time      GRAM NEGATIVE RODS IN BOTH AEROBIC AND ANAEROBIC BOTTLES CRITICAL RESULT CALLED TO, READ BACK BY AND VERIFIED WITH: Yves DillCHARLES SHANLEVER 01/11/2018 1616 KLW Performed at [facility], [address] [place], [place] [address]    Culture GRAM NEGATIVE RODS    Report Status PENDING   MRSA PCR Screening     Status: None   Collection Time: 01/01/2018  5:31 AM  Result Value Ref Range   MRSA by PCR NEGATIVE NEGATIVE    Comment:        The GeneXpert MRSA Assay (FDA approved for NASAL specimens only), is one component of a comprehensive MRSA colonization surveillance program. It is not intended to diagnose MRSA infection nor to guide or monitor treatment for MRSA infections. Performed at [facility], [address] [place], [place] [address]   Lactic acid, plasma     Status: Abnormal   Collection Time: 01/11/2018  8:02 AM  Result Value Ref Range   Lactic Acid, Venous 10.7 (HH) 0.5 - 1.9 mmol/L    Comment: CRITICAL RESULT CALLED TO, READ BACK BY AND VERIFIED WITH [place] FOSTER AT [phone] 01/25/2018 DAS Performed at [facility], [address] [place], [place] [address]   Urine Drug  Screen, Qualitative ([facility] only)     Status: Abnormal   Collection Time: 01/15/2018 11:04 AM  Result Value Ref Range   Tricyclic, Ur Screen NONE DETECTED NONE DETECTED   Amphetamines, Ur Screen NONE DETECTED NONE DETECTED   MDMA (Ecstasy)Ur Screen NONE DETECTED NONE DETECTED   Cocaine Metabolite,Ur [place] NONE DETECTED NONE DETECTED   Opiate, Ur Screen NONE DETECTED NONE DETECTED   Phencyclidine (PCP) Ur S NONE DETECTED NONE DETECTED   Cannabinoid 50 Ng, Ur JAARS NONE DETECTED NONE DETECTED   Barbiturates, Ur Screen NONE DETECTED NONE DETECTED   Benzodiazepine, Ur Scrn POSITIVE (A) NONE DETECTED   Methadone Scn, Ur NONE DETECTED NONE DETECTED    Comment: (NOTE) Tricyclics + metabolites, urine    Cutoff 1000 ng/mL Amphetamines + metabolites, urine  Cutoff 1000 ng/mL MDMA (Ecstasy), urine              Cutoff 500 ng/mL Cocaine Metabolite, urine          Cutoff 300 ng/mL Opiate + metabolites, urine        Cutoff 300 ng/mL Phencyclidine (PCP), urine  Cutoff 25 ng/mL Cannabinoid, urine                 Cutoff 50 ng/mL Barbiturates + metabolites, urine  Cutoff 200 ng/mL Benzodiazepine, urine              Cutoff 200 ng/mL Methadone, urine                   Cutoff 300 ng/mL The urine drug screen provides only a preliminary, unconfirmed analytical test result and should not be used for non-medical purposes. Clinical consideration and professional judgment should be applied to any positive drug screen result due to possible interfering substances. A more specific alternate chemical method must be used in order to obtain a confirmed analytical result. Gas chromatography / mass spectrometry (GC/MS) is the preferred confirmat ory method. Performed at [facility], [address] [place], [place] [address]   Acetaminophen level     Status: None   Collection Time: February 19, 2018 11:33 AM  Result Value Ref Range   Acetaminophen (Tylenol), Serum 26 10 - 30 ug/mL    Comment: (NOTE) Therapeutic  concentrations vary significantly. A range of 10-30 ug/mL  may be an effective concentration for many patients. However, some  are best treated at concentrations outside of this range. Acetaminophen concentrations >150 ug/mL at 4 hours after ingestion  and >50 ug/mL at 12 hours after ingestion are often associated with  toxic reactions. Performed at [facility], [address] [place], [place] [address]   Salicylate level     Status: None   Collection Time: February 19, 2018 11:33 AM  Result Value Ref Range   Salicylate Lvl <7.0 2.8 - 30.0 mg/dL    Comment: Performed at [facility], [address] [place], [place] [address]  Potassium     Status: Abnormal   Collection Time: 02/19/2018  2:36 PM  Result Value Ref Range   Potassium 2.9 (L) 3.5 - 5.1 mmol/L    Comment: Performed at [facility], [address], [place], [place] [address]  Magnesium     Status: None   Collection Time: Feb 19, 2018  2:36 PM  Result Value Ref Range   Magnesium 2.3 1.7 - 2.4 mg/dL    Comment: Performed at [facility], [address] [place], [place] [address]  Type and screen [facility]     Status: None   Collection Time: February 19, 2018  2:44 PM  Result Value Ref Range   ABO/RH(D) O POS    Antibody Screen NEG    Sample Expiration      02/23/2018 Performed at [facility], [address], [place], [place] [address]   Prepare fresh frozen plasma     Status: None (Preliminary result)   Collection Time: Feb 19, 2018  2:45 PM  Result Value Ref Range   Unit Number [id number]    Blood Component Type THAWED PLASMA    Unit division 00    Status of Unit ISSUED    Transfusion Status OK TO TRANSFUSE    Unit Number [id number]    Blood Component Type THAWED PLASMA    Unit division 00    Status of Unit ISSUED    Transfusion Status      OK TO TRANSFUSE Performed at [facility], [address] [place], [place] [address]   Blood gas,  arterial     Status: Abnormal   Collection Time: 02-19-2018  4:01 PM  Result Value Ref Range   FIO2 0.35  Delivery systems VENTILATOR    Mode PRESSURE REGULATED VOLUME CONTROL    VT 420 mL   LHR 26 resp/min   Peep/cpap 5.0 cm H20   pH, Arterial 7.44 7.350 - 7.450   pCO2 arterial 26 (L) 32.0 - 48.0 mmHg   pO2, Arterial 124 (H) 83.0 - 108.0 mmHg   Bicarbonate 17.7 (L) 20.0 - 28.0 mmol/L   Acid-base deficit 4.9 (H) 0.0 - 2.0 mmol/L   O2 Saturation 98.9 %   Patient temperature 37.0    Collection site RIGHT RADIAL    Sample type ARTERIAL DRAW    Allens test (pass/fail) PASS PASS    Comment: Performed at [facility], [address], [place], [place] [address]    Ct Head Wo Contrast  Result Date: February 17, 2018 CLINICAL DATA:  Altered mental status.  Alcohol abuse.  Cirrhosis. EXAM: CT HEAD WITHOUT CONTRAST TECHNIQUE: Contiguous axial images were obtained from the base of the skull through the vertex without intravenous contrast. COMPARISON:  None. FINDINGS: Brain: There is moderately large volume acute hemorrhage in the lateral and third ventricles with small volume blood in the fourth ventricle. The lateral ventricles are severely dilated with periventricular hypoattenuation consistent with acute hydrocephalus and transependymal CSF flow. The third ventricle is also dilated. There is a 6 mm focus of hemorrhage which appears to be within the medial right thalamus, immediately adjacent to blood products in the third ventricle. There is mild surrounding edema in the right thalamus. There is no midline shift, however the cerebral sulci are diffusely effaced, and there is effacement of the basilar cisterns. There is no cerebellar tonsillar herniation. No acute large territory vascular infarct is identified. There is no extra-axial fluid collection. Vascular: No hyperdense vessel. Skull: No fracture or focal osseous lesion. Sinuses/Orbits: Small volume bubbly secretions in the left sphenoid  sinus. Clear mastoid air cells. Unremarkable included orbits. Other: None. IMPRESSION: 1. Small acute right thalamic hemorrhage with intraventricular extension. Moderately large volume of intraventricular blood with severe hydrocephalus. 2. Diffuse cerebral sulcal and basilar cistern effacement. No midline shift or tonsillar herniation. Critical Value/emergent results were called by telephone at the time of interpretation on Feb 17, 2018 at 2:30 pm to Dr. Tora Kindred , who verbally acknowledged these results. Electronically Signed   By: Sebastian Ache M.D.   On: 02/17/2018 14:34   Dg Chest Port 1 View  Result Date: 02/17/2018 CLINICAL DATA:  Intubation EXAM: PORTABLE CHEST 1 VIEW COMPARISON:  02-17-2018 FINDINGS: Endotracheal tube tip is at the level of the clavicular heads. Side port of the nasogastric tube projects over the stomach. The heart size and mediastinal contours are within normal limits. Both lungs are clear. The visualized skeletal structures are unremarkable. IMPRESSION: ET tube tip at the level of the clavicular heads. Electronically Signed   By: Deatra Robinson M.D.   On: February 17, 2018 04:46   Dg Chest Portable 1 View  Result Date: February 17, 2018 CLINICAL DATA:  Altered mental status tachycardia EXAM: PORTABLE CHEST 1 VIEW COMPARISON:  None. FINDINGS: Normal heart size. Normal mediastinal contour. No pneumothorax. No pleural effusion. Low lung volumes. No pulmonary edema. No acute consolidative airspace disease. IMPRESSION: Low lung volumes with no active cardiopulmonary disease. Electronically Signed   By: Delbert Phenix M.D.   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  Patient has a significant coagulopathy as  well as thrombocytopenia which precludes placement of an EVD at this time.  However if the coagulopathy and thrombocytopenia can be corrected, placement of a EVD can be considered.  However her prognosis remains grim even if an EVD is placed.  She has a profound coma with very limited brainstem function, as well as significant medical comorbidities related to her alcoholism and cirrhosis.  I have discussed my assessment and recommendations with Dr. Amada Jupiter, and have discussed my reservations regarding her prognosis even with maximal treatment.  Hewitt Shorts, MD 12/30/2017, 6:55 PM

## 2018-01-25 ENCOUNTER — Inpatient Hospital Stay (HOSPITAL_COMMUNITY): Payer: Medicaid Other

## 2018-01-25 DIAGNOSIS — R140 Abdominal distension (gaseous): Secondary | ICD-10-CM

## 2018-01-25 DIAGNOSIS — D649 Anemia, unspecified: Secondary | ICD-10-CM

## 2018-01-25 DIAGNOSIS — D689 Coagulation defect, unspecified: Secondary | ICD-10-CM

## 2018-01-25 LAB — GLUCOSE, CAPILLARY
GLUCOSE-CAPILLARY: 88 mg/dL (ref 70–99)
GLUCOSE-CAPILLARY: 63 mg/dL — AB (ref 70–99)
Glucose-Capillary: 106 mg/dL — ABNORMAL HIGH (ref 70–99)
Glucose-Capillary: 120 mg/dL — ABNORMAL HIGH (ref 70–99)
Glucose-Capillary: 60 mg/dL — ABNORMAL LOW (ref 70–99)
Glucose-Capillary: 62 mg/dL — ABNORMAL LOW (ref 70–99)
Glucose-Capillary: 74 mg/dL (ref 70–99)
Glucose-Capillary: 74 mg/dL (ref 70–99)
Glucose-Capillary: 91 mg/dL (ref 70–99)

## 2018-01-25 LAB — CBC
HCT: 19.7 % — ABNORMAL LOW (ref 36.0–46.0)
Hemoglobin: 7.2 g/dL — ABNORMAL LOW (ref 12.0–15.0)
MCH: 32.3 pg (ref 26.0–34.0)
MCHC: 36.5 g/dL — ABNORMAL HIGH (ref 30.0–36.0)
MCV: 88.3 fL (ref 80.0–100.0)
Platelets: 89 10*3/uL — ABNORMAL LOW (ref 150–400)
RBC: 2.23 MIL/uL — ABNORMAL LOW (ref 3.87–5.11)
RDW: 18.8 % — ABNORMAL HIGH (ref 11.5–15.5)
WBC: 5.7 10*3/uL (ref 4.0–10.5)
nRBC: 0.5 % — ABNORMAL HIGH (ref 0.0–0.2)

## 2018-01-25 LAB — PREPARE FRESH FROZEN PLASMA
Unit division: 0
Unit division: 0
Unit division: 0
Unit division: 0
Unit division: 0

## 2018-01-25 LAB — BPAM FFP
BLOOD PRODUCT EXPIRATION DATE: 202001032359
Blood Product Expiration Date: 202001022359
Blood Product Expiration Date: 202001022359
Blood Product Expiration Date: 202001032359
Blood Product Expiration Date: 202001032359
Blood Product Expiration Date: 202001032359
ISSUE DATE / TIME: 201912302007
ISSUE DATE / TIME: 201912302007
ISSUE DATE / TIME: 201912302038
ISSUE DATE / TIME: 201912302307
ISSUE DATE / TIME: 201912302220
ISSUE DATE / TIME: 201912302220
Unit Type and Rh: 5100
Unit Type and Rh: 5100
Unit Type and Rh: 5100
Unit Type and Rh: 5100
Unit Type and Rh: 5100
Unit Type and Rh: 9500

## 2018-01-25 LAB — BPAM PLATELET PHERESIS
Blood Product Expiration Date: 201912312359
ISSUE DATE / TIME: 201912301913
Unit Type and Rh: 6200

## 2018-01-25 LAB — TYPE AND SCREEN
ABO/RH(D): O POS
ABO/RH(D): O POS
Antibody Screen: NEGATIVE
Antibody Screen: NEGATIVE
Unit division: 0

## 2018-01-25 LAB — POCT I-STAT 3, ART BLOOD GAS (G3+)
BICARBONATE: 21.4 mmol/L (ref 20.0–28.0)
O2 Saturation: 98 %
TCO2: 22 mmol/L (ref 22–32)
pCO2 arterial: 24.5 mmHg — ABNORMAL LOW (ref 32.0–48.0)
pH, Arterial: 7.55 — ABNORMAL HIGH (ref 7.350–7.450)
pO2, Arterial: 93 mmHg (ref 83.0–108.0)

## 2018-01-25 LAB — COMPREHENSIVE METABOLIC PANEL
ALT: 28 U/L (ref 0–44)
AST: 109 U/L — ABNORMAL HIGH (ref 15–41)
Albumin: 2.8 g/dL — ABNORMAL LOW (ref 3.5–5.0)
Alkaline Phosphatase: 91 U/L (ref 38–126)
Anion gap: 14 (ref 5–15)
BUN: <5 mg/dL — ABNORMAL LOW (ref 6–20)
CO2: 22 mmol/L (ref 22–32)
Calcium: 8.7 mg/dL — ABNORMAL LOW (ref 8.9–10.3)
Chloride: 107 mmol/L (ref 98–111)
Creatinine, Ser: 1.83 mg/dL — ABNORMAL HIGH (ref 0.44–1.00)
GFR calc Af Amer: 41 mL/min — ABNORMAL LOW (ref >60)
GFR calc non Af Amer: 35 mL/min — ABNORMAL LOW (ref >60)
Glucose, Bld: 91 mg/dL (ref 70–99)
Potassium: 4.3 mmol/L (ref 3.5–5.1)
SODIUM: 143 mmol/L (ref 135–145)
Total Bilirubin: 4.7 mg/dL — ABNORMAL HIGH (ref 0.3–1.2)
Total Protein: 7.7 g/dL (ref 6.5–8.1)

## 2018-01-25 LAB — PREPARE PLATELET PHERESIS: Unit division: 0

## 2018-01-25 LAB — PROTIME-INR
INR: 1.54
Prothrombin Time: 18.3 seconds — ABNORMAL HIGH (ref 11.4–15.2)

## 2018-01-25 LAB — HEPATITIS PANEL, ACUTE
HCV Ab: 0.3 s/co ratio (ref 0.0–0.9)
HEP B C IGM: NEGATIVE
Hep A IgM: NEGATIVE
Hepatitis B Surface Ag: NEGATIVE

## 2018-01-25 LAB — MAGNESIUM: Magnesium: 2.1 mg/dL (ref 1.7–2.4)

## 2018-01-25 LAB — BPAM RBC
Blood Product Expiration Date: 202001282359
ISSUE DATE / TIME: 201912302219
Unit Type and Rh: 5100

## 2018-01-25 LAB — LACTIC ACID, PLASMA: Lactic Acid, Venous: 3.8 mmol/L (ref 0.5–1.9)

## 2018-01-25 LAB — HIV ANTIBODY (ROUTINE TESTING W REFLEX): HIV Screen 4th Generation wRfx: NONREACTIVE

## 2018-01-25 LAB — PHOSPHORUS: Phosphorus: 2.2 mg/dL — ABNORMAL LOW (ref 2.5–4.6)

## 2018-01-25 MED ORDER — DEXTROSE 50 % IV SOLN: 25.0000 mL | Freq: Once | INTRAVENOUS | Status: DC

## 2018-01-25 MED ORDER — SODIUM CHLORIDE 0.9 % IV SOLN
2.0000 g | INTRAVENOUS | Status: DC
  Administered 2018-01-25 – 2018-01-27 (×3): 2 g via INTRAVENOUS
  Filled 2018-01-25 (×3): qty 20

## 2018-01-25 MED ORDER — SODIUM CHLORIDE 4 MEQ/ML IV SOLN
INTRAVENOUS | Status: DC
  Administered 2018-01-25 – 2018-01-27 (×3): via INTRAVENOUS
  Filled 2018-01-25 (×7): qty 1000

## 2018-01-25 MED ORDER — CHLORHEXIDINE GLUCONATE 0.12% ORAL RINSE (MEDLINE KIT)
15.0000 mL | Freq: Two times a day (BID) | OROMUCOSAL | Status: DC
  Administered 2018-01-25 – 2018-01-27 (×5): 15 mL via OROMUCOSAL

## 2018-01-25 MED ORDER — FUROSEMIDE 10 MG/ML IJ SOLN
40.0000 mg | Freq: Once | INTRAMUSCULAR | Status: AC
  Administered 2018-01-25: 40 mg via INTRAVENOUS
  Filled 2018-01-25: qty 4

## 2018-01-25 MED ORDER — DEXTROSE 50 % IV SOLN
1.0000 | INTRAVENOUS | Status: DC | PRN
  Administered 2018-01-25 – 2018-01-26 (×2): 50 mL via INTRAVENOUS
  Filled 2018-01-25 (×2): qty 50

## 2018-01-25 MED ORDER — LIDOCAINE HCL (PF) 1 % IJ SOLN
INTRAMUSCULAR | Status: AC
  Filled 2018-01-25: qty 5

## 2018-01-25 MED ORDER — DEXTROSE 50 % IV SOLN
INTRAVENOUS | Status: AC
  Administered 2018-01-25: 13:00:00
  Filled 2018-01-25: qty 50

## 2018-01-25 MED ORDER — ORAL CARE MOUTH RINSE
15.0000 mL | OROMUCOSAL | Status: DC
  Administered 2018-01-25 – 2018-01-27 (×23): 15 mL via OROMUCOSAL

## 2018-01-25 MED ORDER — SODIUM CHLORIDE 0.9 % IV SOLN: INTRAVENOUS | Status: DC | PRN

## 2018-01-25 MED ORDER — DEXTROSE 50 % IV SOLN
INTRAVENOUS | Status: AC
  Administered 2018-01-25: 17:00:00
  Filled 2018-01-25: qty 50

## 2018-01-25 NOTE — Progress Notes (Signed)
Family updated by Dr Roda ShuttersXu. Family agreed to make pt DNR.

## 2018-01-25 NOTE — Progress Notes (Signed)
PCCM INTERVAL PROGRESS NOTE  [facility] E. Col[facility]. BC prelim growing GNR.   Plan Ceftriaxone 2G IV daily    Joneen RoachPaul Gypsy Kellogg, AGACNP-BC [facility] Pulmonary/Critical Care Pager [phone] or [phone]  01/25/2018 6:54 AM

## 2018-01-25 NOTE — Progress Notes (Signed)
Discussed temps and hypoglycemia with Dr Roda ShuttersXu. Orders received.

## 2018-01-25 NOTE — Progress Notes (Signed)
RN spoke with [facility] and referral number in doc flowsheets. They will send someone out with information to come by. Please notify if any changes occur. RN will continue to monitor.

## 2018-01-25 NOTE — Progress Notes (Signed)
Patient transported to CT and returned to 4N15. No complications. Vital signs stable throughout. RT will continue to monitor.

## 2018-01-25 NOTE — Progress Notes (Signed)
Cooling blanket ordered. Per portable ALL blankets in use. Pt not candidate for Tylenol or Motrin. Ice packs placed.

## 2018-01-25 NOTE — Progress Notes (Signed)
STROKE TEAM PROGRESS NOTE   SUBJECTIVE (INTERVAL HISTORY) Her RN is at the bedside. Overall her condition is stable, but still unresponsive with decerebrate posturing on pain stimulation. Had EVD overnight and draining under pressure of 20. I called mom over the phone and she is coming in 2 hours but would not want make any decision before talking to pt sister and brother.    OBJECTIVE Temp:  [93.4 F (34.1 C)-100.4 F (38 C)] 100 F (37.8 C) (12/31 1000) Pulse Rate:  [83-137] 110 (12/31 1000) Cardiac Rhythm: Sinus tachycardia (12/31 0700) Resp:  [21-34] 26 (12/31 1000) BP: (84-153)/(41-81) 95/46 (12/31 1000) SpO2:  [96 %-100 %] 97 % (12/31 1000) FiO2 (%):  [30 %-35 %] 30 % (12/31 0730)  Recent Labs  Lab 01/02/2018 0513 12/26/2017 2138 01/25/18 0007 01/25/18 0311 01/25/18 0733  GLUCAP 103* 140* 120* 88 74   Recent Labs  Lab 01/23/18 2233 01/23/18 2246 01/11/2018 0516 01/04/2018 1436 01/13/2018 2107 01/25/18 0329  NA 137  --  136  --  141 143  K 2.7*  --  3.0* 2.9* 2.8* 4.3  CL 108  --  109  --  108 107  CO2 11*  --  8*  --  20* 22  GLUCOSE 104*  --  109*  --  162* 91  BUN <5*  --  <5*  --  <5* <5*  CREATININE 1.58*  --  1.32*  --  1.65* 1.83*  CALCIUM 8.3*  --  7.5*  --  7.9* 8.7*  MG  --  1.1* 1.1* 2.3 2.1 2.1  PHOS  --   --  4.5  --   --  2.2*   Recent Labs  Lab 01/23/18 2233 01/21/2018 0516 01/25/18 0329  AST 174* 152* 109*  ALT 36 30 28  ALKPHOS 174* 130* 91  BILITOT 5.9* 5.8* 4.7*  PROT 7.9 6.8 7.7  ALBUMIN 2.4* 2.0* 2.8*   Recent Labs  Lab 01/23/18 2233 01/23/2018 0516 12/26/2017 1830 01/02/2018 2107 01/25/18 0329  WBC 5.1 9.3 4.2 4.8 5.7  HGB 9.5* 8.5* 7.1* 6.4* 7.2*  HCT 26.0* 23.0* 19.7* 17.7* 19.7*  MCV 89.0 91.6 88.7 88.5 88.3  PLT 53* 53* 30* 49* 89*   Recent Labs  Lab 01/23/18 2246  TROPONINI <0.03   Recent Labs    01/23/2018 0516 01/07/2018 1830 01/12/2018 2130 01/25/18 0329  LABPROT 24.5* 25.6* 22.1* 18.3*  INR 2.24 2.37 1.97 1.54   Recent  Labs    01/23/18 2233  COLORURINE YELLOW*  LABSPEC 1.010  PHURINE 5.0  GLUCOSEU NEGATIVE  HGBUR SMALL*  BILIRUBINUR NEGATIVE  KETONESUR NEGATIVE  PROTEINUR NEGATIVE  NITRITE NEGATIVE  LEUKOCYTESUR NEGATIVE       Component Value Date/Time   TRIG 602 (H) 01/03/2018 0516   No results found for: HGBA1C    Component Value Date/Time   LABOPIA NONE DETECTED 01/09/2018 1104   COCAINSCRNUR NONE DETECTED 01/13/2018 1104   LABBENZ POSITIVE (A) 01/13/2018 1104   AMPHETMU NONE DETECTED 12/29/2017 1104   THCU NONE DETECTED 01/25/2018 1104   LABBARB NONE DETECTED 01/23/2018 1104    Recent Labs  Lab 01/23/18 2233  ETH 160*    I have personally reviewed the radiological images below and agree with the radiology interpretations.  Ct Head Wo Contrast  Result Date: 01/09/2018 CLINICAL DATA:  Altered mental status.  Alcohol abuse.  Cirrhosis. EXAM: CT HEAD WITHOUT CONTRAST TECHNIQUE: Contiguous axial images were obtained from the base of the skull through the vertex without intravenous  contrast. COMPARISON:  None. FINDINGS: Brain: There is moderately large volume acute hemorrhage in the lateral and third ventricles with small volume blood in the fourth ventricle. The lateral ventricles are severely dilated with periventricular hypoattenuation consistent with acute hydrocephalus and transependymal CSF flow. The third ventricle is also dilated. There is a 6 mm focus of hemorrhage which appears to be within the medial right thalamus, immediately adjacent to blood products in the third ventricle. There is mild surrounding edema in the right thalamus. There is no midline shift, however the cerebral sulci are diffusely effaced, and there is effacement of the basilar cisterns. There is no cerebellar tonsillar herniation. No acute large territory vascular infarct is identified. There is no extra-axial fluid collection. Vascular: No hyperdense vessel. Skull: No fracture or focal osseous lesion.  Sinuses/Orbits: Small volume bubbly secretions in the left sphenoid sinus. Clear mastoid air cells. Unremarkable included orbits. Other: None. IMPRESSION: 1. Small acute right thalamic hemorrhage with intraventricular extension. Moderately large volume of intraventricular blood with severe hydrocephalus. 2. Diffuse cerebral sulcal and basilar cistern effacement. No midline shift or tonsillar herniation. Critical Value/emergent results were called by telephone at the time of interpretation on 03-10-2017 at 2:30 pm to Dr. Tora KindredJOHN CONFORTI , who verbally acknowledged these results. Electronically Signed   By: Sebastian AcheAllen  Grady M.D.   On: 03-10-2017 14:34   Portable Chest Xray  Result Date: 01/25/2018 CLINICAL DATA:  Endotracheal tube in place. Intracranial hemorrhage with hydrocephalus. EXAM: PORTABLE CHEST 1 VIEW COMPARISON:  One-view chest x-ray 03-10-2017 FINDINGS: The heart size is exaggerated by low lung volumes. The endotracheal tube terminates 1.5 cm above the carina and could be pulled back 1-2 cm for more optimal positioning. Increasing interstitial edema is present. No significant airspace consolidation is present. Visualized soft tissues and bony thorax are unremarkable. IMPRESSION: 1. Increasing interstitial edema, neurogenic. 2. Endotracheal tube terminates only 1.5 cm from the carina. Electronically Signed   By: Marin Robertshristopher  Mattern M.D.   On: 01/25/2018 06:55   Dg Chest Portable 1 View  Result Date: 03-10-2017 CLINICAL DATA:  Altered mental status tachycardia EXAM: PORTABLE CHEST 1 VIEW COMPARISON:  None. FINDINGS: Normal heart size. Normal mediastinal contour. No pneumothorax. No pleural effusion. Low lung volumes. No pulmonary edema. No acute consolidative airspace disease. IMPRESSION: Low lung volumes with no active cardiopulmonary disease. Electronically Signed   By: Delbert PhenixJason A Poff M.D.   On: 03-10-2017 02:58   CT repeat pending   PHYSICAL EXAM  Temp:  [93.4 F (34.1 C)-100.4 F (38 C)] 100  F (37.8 C) (12/31 1000) Pulse Rate:  [83-137] 110 (12/31 1000) Resp:  [21-34] 26 (12/31 1000) BP: (84-153)/(41-81) 95/46 (12/31 1000) SpO2:  [96 %-100 %] 97 % (12/31 1000) FiO2 (%):  [30 %-35 %] 30 % (12/31 0730)  General - Well nourished, well developed, intubated and not responsive.  Ophthalmologic - fundi not visualized due to noncooperation.  Cardiovascular - Regular rate and rhythm, but tachycardia.  Neuro - intubated on low dose sedation, not responsive. Eyes closed, with eye forced open, left pupil 2.255mm and right pupil 4mm, not reactive to light, no doll's eyes, not blinking to visual threat. Left corneal absent but right corneal weakness present. Gag present. Breathing over the vent, with pain stimulation, left UE slight withdraw and then became decerebrate posturing. Increased muscle tone in all 4 extremities with stimulation. No movement on RUE and BLE with pain, babainski positive bilaterally. Sensation, coordination and gait not tested.   ASSESSMENT/PLAN Ms. Vassie Lollbony Sisler is a 32  y.o. female with history of alcoholic cirrhosis, ascites admitted for confusion. Found to have acidosis, sepsis, coagulopathy, thrombocytopenia, anemia and AKI. 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 No tPA given due to ICH.    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 Mom  will come in 2 hours but would not want make any decision before talking to pt sister and brother  Severe hydrocephalus  CT showed severe dilated ventricles  NSG on board  S/p EVD on drainage, ICP set at 20 - consider to lower to 10  Repeat CT pending  Clinically no significant improvement  Poor prognosis  Mom is coming for further discussion  Alcoholic cirrhosis, decompensated  Coagulopathy - INR 2.24->2.37->FFP->1.97->FFP->1.54  Thrombocytopenia - platelet 53->30->49->transfusion->89  Anemia Hb 6.4->PRBC->7.2  Hx of ascetics  Hx of alcoholic   Home meds -  Lasix, lactulose, nadolol, spironolactone, bactrim  Sepsis with bacteremia   Hypotension  Lactic acid 10.7->12.8  CCM on board  Blood culture grew GNR - E. Coli, sensitivity pending  On rocephin  UA negative  WBC 4.8->5.7  Hypotension  Unstable, BP low end  On Neo  BP goal < 140  Tobacco abuse  Current smoker  Smoking cessation counseling will be provided if appropriate  Other Active Problems  Severe malnutrition   Decerebrate posturing  Hospital day # 1  This patient is critically ill due to ICH, IVH, severe hydrocephalus, sepsis, bacteremia, decompensated cirrhosis, coagulopathy, anemia, thrombocytopenia, hypotension and at significant risk of neurological worsening, death form recurrent ICH, IVH, worsening hydrocephalus, seizure, brain herniation, brain death, vegetative state, bleeding, septic shock. This patient's care requires constant monitoring of vital signs, hemodynamics, respiratory and cardiac monitoring, review of multiple databases, neurological assessment, discussion with family, other specialists and medical decision making of high complexity. I spent 45 minutes of neurocritical care time in the care of this patient. I had long discussion with mom over the phone, updated pt current condition, treatment plan and poor prognosis. She expressed understanding and appreciation. She  will come in 2 hours and discuss with her son and daughter.    Marvel Plan, MD PhD Stroke Neurology 01/25/2018 10:48 AM    To contact Stroke Continuity provider, please refer to [email or web address]. After hours, contact General Neurology

## 2018-01-25 NOTE — Progress Notes (Signed)
Subjective: Patient continues on ventilator via ETT.  Given 1 unit of platelets and several units of fresh frozen plasma.  Platelet count without any improvement (had [facility]K [facility], most recently after 1 unit of platelets was 49K).  Minimal improvement in coagulopathy after several units of FFP (INR went from 2.24 to 1.97).  Being given a second unit of platelets and additional FFP (currently sixth unit of FFP overall).  Labs to be repeated by staff after transfusions are completed.  Objective: Vital signs in last 24 hours: Vitals:   01/14/2018 2300 01/09/2018 2315 01/23/2018 2330 01/11/2018 2331  BP: (!) 102/59 (!) 105/57 109/60 (!) 105/57  Pulse: 95 93 98 99  Resp: (!) 26 (!) 26 (!) 26 (!) 26  Temp: (!) 96.8 F (36 C) (!) 96.8 F (36 C) (!) 97 F (36.1 C) (!) 97 F (36.1 C)  TempSrc:      SpO2: 100% 100% 100% 100%    Intake/Output from previous day: 12/30 0701 - 12/31 0700 In: 881 [Blood:881] Out: 200 [Urine:200] Intake/Output this shift: Total I/O In: 881 [Blood:881] Out: 200 [Urine:200]  Physical Exam: Not opening eyes to voice or pain.  Pupils: Left 4 mm, round, nonreactive to light; right 8 mm, round, nonreactive to light.  Left corneal more vigorous than right corneal.  CBC Recent Labs    12/27/2017 1830 01/04/2018 2107  WBC 4.2 4.8  HGB 7.1* 6.4*  HCT 19.7* 17.7*  PLT 30* 49*   BMET Recent Labs    01/19/2018 0516 01/07/2018 1436 01/15/2018 2107  NA 136  --  141  K 3.0* 2.9* 2.8*  CL 109  --  108  CO2 8*  --  20*  GLUCOSE 109*  --  162*  BUN <5*  --  <5*  CREATININE 1.32*  --  1.65*  CALCIUM 7.5*  --  7.9*   ABG    Component Value Date/Time   PHART 7.534 (H) 01/17/2018 2041   PCO2ART 23.5 (L) 01/07/2018 2041   PO2ART 108.0 01/21/2018 2041   HCO3 20.2 12/30/2017 2041   TCO2 21 (L) 01/23/2018 2041   ACIDBASEDEF 2.0 01/04/2018 2041   O2SAT 99.0 01/21/2018 2041    Studies/Results: Ct Head Wo Contrast  Result Date: 01/19/2018 CLINICAL DATA:   Altered mental status.  Alcohol abuse.  Cirrhosis. EXAM: CT HEAD WITHOUT CONTRAST TECHNIQUE: Contiguous axial images were obtained from the base of the skull through the vertex without intravenous contrast. COMPARISON:  None. FINDINGS: Brain: There is moderately large volume acute hemorrhage in the lateral and third ventricles with small volume blood in the fourth ventricle. The lateral ventricles are severely dilated with periventricular hypoattenuation consistent with acute hydrocephalus and transependymal CSF flow. The third ventricle is also dilated. There is a 6 mm focus of hemorrhage which appears to be within the medial right thalamus, immediately adjacent to blood products in the third ventricle. There is mild surrounding edema in the right thalamus. There is no midline shift, however the cerebral sulci are diffusely effaced, and there is effacement of the basilar cisterns. There is no cerebellar tonsillar herniation. No acute large territory vascular infarct is identified. There is no extra-axial fluid collection. Vascular: No hyperdense vessel. Skull: No fracture or focal osseous lesion. Sinuses/Orbits: Small volume bubbly secretions in the left sphenoid sinus. Clear mastoid air cells. Unremarkable included orbits. Other: None. IMPRESSION: 1. Small acute right thalamic hemorrhage with intraventricular extension. Moderately large volume of intraventricular blood with severe hydrocephalus. 2. Diffuse cerebral sulcal  and basilar cistern effacement. No midline shift or tonsillar herniation. Critical Value/emergent results were called by telephone at the time of interpretation on 07/04/2017 at 2:30 pm to Dr. Tora KindredJOHN CONFORTI , who verbally acknowledged these results. Electronically Signed   By: Sebastian AcheAllen  Grady M.D.   On: 07/04/2017 14:34   Dg Chest Port 1 View  Result Date: 07/04/2017 CLINICAL DATA:  Intubation EXAM: PORTABLE CHEST 1 VIEW COMPARISON:  07/04/2017 FINDINGS: Endotracheal tube tip is at the level of  the clavicular heads. Side port of the nasogastric tube projects over the stomach. The heart size and mediastinal contours are within normal limits. Both lungs are clear. The visualized skeletal structures are unremarkable. IMPRESSION: ET tube tip at the level of the clavicular heads. Electronically Signed   By: Deatra RobinsonKevin  Herman M.D.   On: 07/04/2017 04:46   Dg Chest Portable 1 View  Result Date: 07/04/2017 CLINICAL DATA:  Altered mental status tachycardia EXAM: PORTABLE CHEST 1 VIEW COMPARISON:  None. FINDINGS: Normal heart size. Normal mediastinal contour. No pneumothorax. No pleural effusion. Low lung volumes. No pulmonary edema. No acute consolidative airspace disease. IMPRESSION: Low lung volumes with no active cardiopulmonary disease. Electronically Signed   By: Delbert PhenixJason A Poff M.D.   On: 07/04/2017 02:58    Assessment/Plan: Patient remains in coma with limited brainstem function with multiple major medical comorbidities, prognosis remains grim.  Cannot place EVD at this time because of persistent coagulopathy and thrombocytopenia.  If EVD were to be attempted, it is likely that we would create intraparenchymal hemorrhage including intracerebral hematoma and additional intraventricular hemorrhage.  Hewitt ShortsNUDELMAN, W, MD 01/25/2018, 12:59 AM

## 2018-01-25 NOTE — Progress Notes (Signed)
OT Cancellation Note  Patient Details Name: Janet Weiss MRN: [id number] DOB: 1985-11-25   Cancelled Treatment:    Reason Eval/Treat Not Completed: Patient not medically ready. Per vented and just had ventric drain placed at 6am, per PT.  OT to return as medically appropriate and able to initiate OT eval.   Chancy Milroyhristie S Hosea Hanawalt, OT Acute Rehabilitation Services Pager [phone] Office [phone]   [name] 01/25/2018, 8:54 AM

## 2018-01-25 NOTE — Progress Notes (Signed)
Lactic Acid of 3.8 called to Dr Denese KillingsAgarwala.

## 2018-01-25 NOTE — Progress Notes (Signed)
Nutrition Brief Note  Pt with PMH of alcoholic cirrhosis and ascites admitted for confusion. Found to have acidosis, sepsis, coagulopathy, thrombocytopenia, anemia and AKI. 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 [facility]   Pt discussed during ICU rounds and with RN.  Per MD pt with poor prognosis and is posturing, mom coming in to discuss with medical team later today.   No nutrition interventions planned at this time. Please consult RD if appropriate for plan of care.    Kendell BaneHeather Laquitta Dominski RD, LDN, CNSC [phone] Pager [phone] After Hours Pager

## 2018-01-25 NOTE — Progress Notes (Addendum)
Met with mom, brother and sister as well as other family members at bedside. Pt another brother who is in jail is also on speaker phone during the meeting. I had long discussion family, updated pt current condition, treatment options and grave prognosis. They are in agreement with DNR. They requested more time for discussion about further plan for GOC. Currently pt has developed hypoglycemia and decreased UOP with abdominal distention. Concerning for worsening ascites and hepatorenal syndrome.   Plan:  Cerebrovascular\Neuro   Continue EVD with drainage.   Repeat CT head showed improved hydrocephalus but still present  Neuro exam unchanged with poor prognosis  Family updated and they are in agreement with DNR  Ammonia in am   Cardiovascular   Blood pressure low   On neo  BP goal 100-140  K+ WNL  Respiratory   Intubated on sedation  CCM on board  Hematology  - platelet monitoring s/p transfusion - H/H monitoring s/p transfusion - transfuse if needed  ID\Immunology  - fever 101.3 - avoid tylenol due to liver failure - avoid NSAIDs due to thrombocytopenia and AKI - cooling blanket - B Cx showed E Coli bacteremia - on Rocephin - sensitivity pending  Renal    Elevated Cre 1.83 and decreased UOP  CMP monitoring  Concerning for hepatorenal syndrome  Lasix 47m once  Endocrine\Metabolic  Hypoglycemia  DQ22x 2  On D10NS @ 30cc  D50 PRN   Liver   -Diet: NPO -decompensated cirrhosis -ascites  -KUB pending -albumin 2.8 -AST elevated 109, INR 1.54 s/p FFP -CMP monitoring - contraindicated for paracenteses due to low platelet and elevated INR - lasix 475monce - gentle hydration   Fluid\Electrolytes  -IVF:  D10NS @ 30 -gentle hydration -metabolic acidosis->respiratory alkalosis -on ventilation   Prophylaxis\Quality\Safety:  -SCDs  -elevated INR  Code Status: DNR   JiRosalin HawkingMD PhD Stroke Neurology 01/25/2018 4:37 PM

## 2018-01-25 NOTE — Progress Notes (Signed)
SLP Cancellation Note  Patient Details Name: Janet Weiss MRN: [id number] DOB: 05/06/1985   Cancelled treatment:       Reason Eval/Treat Not Completed: Patient not medically ready   Denni France, Riley NearingBonnie Caroline 01/25/2018, 8:16 AM

## 2018-01-25 NOTE — Progress Notes (Signed)
PT Cancellation Note  Patient Details Name: Janet Weiss MRN: [id number] DOB: 10/28/1985   Cancelled Treatment:    Reason Eval/Treat Not Completed: Patient not medically ready. Pt vented and just had a ventric drain placed at 6am this morning. PT to return as able, as appropriate to complete PT eval.  Lewis ShockAshly Nyzir Dubois, PT, DPT Acute Rehabilitation Services Pager #: [phone] Office #: [phone]    Iona Hansenshly M Jaiveer Panas 01/25/2018, 8:38 AM

## 2018-01-25 NOTE — Op Note (Signed)
6:03 AM  PATIENT:  Janet Weiss  32 y.o. female  PRE-OPERATIVE DIAGNOSIS:  Hydrocephalus, intraventricular hemorrhage, right thalamic intracerebral hematoma  POST-OPERATIVE DIAGNOSIS:  Hydrocephalus, intraventricular hemorrhage, right thalamic intracerebral hematoma  PROCEDURE: Placement of right frontal intraventricular catheter  SURGEON:  Shirlean Kellyobert Nudelman, MD  ANESTHESIA:   local (1% Xylocaine)  DICTATION: At the patient's bedside in the 4 N. ICU and at the request of her treating neurologist Drs. Amada JupiterKirkpatrick and Roman ForestLindzen, and after correction of her coagulopathy and thrombocytopenia with transfusions of FFP and platelets a right frontal intraventricular catheter was placed.  The right frontal scalp was prepped with Betadine solution and draped in a sterile fashion.  The scalp was infiltrated 1% Xylocaine local anesthetic without epinephrine.  A 3 mm in length incision was made in the right mid pupillary line adjacent to the coronal suture.  A twist drill hole was made in the skull, and the dura punctured.  A ventricular catheter was passed into the right frontal horn to a depth of approximately 6 cm.  Xanthochromic CSF drained under pressure.  The catheter was tunneled beneath the scalp and brought out through a separate stab incision.  The initial incision was closed with interrupted 3-0 nylon suture.  The catheter was secured at the exit site with 3-0 nylon suture and again at a second point along the scalp.  The catheter was connected to a closed collection system set to a drainage pressure of 20 cm of water.  A sterile dressing was applied.  The procedure was tolerated well.

## 2018-01-25 NOTE — Progress Notes (Signed)
NAME:  Janet Weiss, MRN:  [id number], DOB:  [birth year], LOS: 1 ADMISSION DATE:  01/13/2018,   HPI/course in hospital  32 year old woman with alcoholic cirrhosis admitted with progressive obtundation. 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  Decompensated liver failure.  Coagulopathy corrected 12/30 and EVD placed.  Dismal prognosis discussed with mother by Dr Roda ShuttersXu.  Past Medical History   Past Medical History:  Diagnosis Date  . Alcohol dependence (HCC)   . Ascites   . Cirrhosis (HCC)   . GERD (gastroesophageal reflux disease)     Past Surgical History:  Procedure Laterality Date  . ESOPHAGOGASTRODUODENOSCOPY N/A 06/28/2014   Procedure: ESOPHAGOGASTRODUODENOSCOPY (EGD);  Surgeon: Wallace CullensPaul Y Oh, MD;  [facility];  Service: Gastroenterology;  Laterality: N/A;      Interim history/subjective:  No improvement in neurological status.  Objective   Blood pressure (!) 106/58, pulse (!) 113, temperature (!) 101.1 F (38.4 C), resp. rate (!) 26, SpO2 100 %.    Vent Mode: PRVC FiO2 (%):  [30 %-35 %] 30 % Set Rate:  [26 bmp] 26 bmp Vt Set:  [420 mL] 420 mL PEEP:  [5 cmH20] 5 cmH20 Plateau Pressure:  [13 cmH20-20 cmH20] 13 cmH20   Intake/Output Summary (Last 24 hours) at 01/25/2018 1732 Last data filed at 01/25/2018 1600 Gross per 24 hour  Intake 3438.22 ml  Output 597 ml  Net 2841.22 ml   There were no vitals filed for this visit.  Examination: Physical Exam  Constitutional: She appears well-developed. She is intubated.  HENT:  Head: Normocephalic and atraumatic.  Eyes: Pupils are equal, round, and reactive to light. Scleral icterus is present.  Downward and L gaze deviation.  Cardiovascular: S1 normal and normal heart sounds.  Respiratory: Breath sounds normal. She is intubated.  GI: She exhibits distension. There is no abdominal tenderness.  Neurological: She is unresponsive.  No response to painful stimuli     Ancillary tests (personally  reviewed)  CBC: Recent Labs  Lab 01/23/18 2233 01/12/2018 0516 01/14/2018 1830 01/11/2018 2107 01/25/18 0329  WBC 5.1 9.3 4.2 4.8 5.7  HGB 9.5* 8.5* 7.1* 6.4* 7.2*  HCT 26.0* 23.0* 19.7* 17.7* 19.7*  MCV 89.0 91.6 88.7 88.5 88.3  PLT 53* 53* 30* 49* 89*    Basic Metabolic Panel: Recent Labs  Lab 01/23/18 2233 01/23/18 2246 01/19/2018 0516 01/17/2018 1436 01/23/2018 2107 01/25/18 0329  NA 137  --  136  --  141 143  K 2.7*  --  3.0* 2.9* 2.8* 4.3  CL 108  --  109  --  108 107  CO2 11*  --  8*  --  20* 22  GLUCOSE 104*  --  109*  --  162* 91  BUN <5*  --  <5*  --  <5* <5*  CREATININE 1.58*  --  1.32*  --  1.65* 1.83*  CALCIUM 8.3*  --  7.5*  --  7.9* 8.7*  MG  --  1.1* 1.1* 2.3 2.1 2.1  PHOS  --   --  4.5  --   --  2.2*   GFR: Estimated Creatinine Clearance: 34.3 mL/min (A) (by C-G formula based on SCr of 1.83 mg/dL (H)). Recent Labs  Lab 01/23/18 2307 01/05/2018 0237 12/30/2017 0516 12/30/2017 0802 01/12/2018 1830 01/18/2018 2107 01/25/18 0329  WBC  --   --  9.3  --  4.2 4.8 5.7  LATICACIDVEN 9.7* 12.8* 12.5* 10.7*  --   --   --  Liver Function Tests: Recent Labs  Lab 01/23/18 2233 06-27-2017 0516 01/25/18 0329  AST 174* 152* 109*  ALT 36 30 28  ALKPHOS 174* 130* 91  BILITOT 5.9* 5.8* 4.7*  PROT 7.9 6.8 7.7  ALBUMIN 2.4* 2.0* 2.8*   Recent Labs  Lab 01/23/18 2233  LIPASE 48   Recent Labs  Lab 01/23/18 2307  AMMONIA 36*    ABG    Component Value Date/Time   PHART 7.550 (H) 01/25/2018 0445   PCO2ART 24.5 (L) 01/25/2018 0445   PO2ART 93.0 01/25/2018 0445   HCO3 21.4 01/25/2018 0445   TCO2 22 01/25/2018 0445   ACIDBASEDEF 2.0 12/04/17 2041   O2SAT 98.0 01/25/2018 0445     Coagulation Profile: Recent Labs  Lab 06-27-2017 0516 06-27-2017 1830 06-27-2017 2130 01/25/18 0329  INR 2.24 2.37 1.97 1.54    Cardiac Enzymes: Recent Labs  Lab 01/23/18 2246  TROPONINI <0.03    HbA1C: No results found for: HGBA1C  CBG: Recent Labs  Lab 01/25/18 0311  01/25/18 0733 01/25/18 1147 01/25/18 1309 01/25/18 1551  GLUCAP 88 74 60* 74 63*   CT head (personally reviewed): mild interval decrease in hydrocephalus following EVD placement.  Microbiology:  Blood + for E. coli  Assessment & Plan:   Critically due cerebral edema secondary to ICH causing coma  Critically ill due to respiratory failure secondary to failure to protect airway requiring mechanical ventilation. Critically ill due to septic shock from E coli bacteremia. SBP suspected source. Critically ill due to decompensated liver failure.   PLAN  Continue EVD drainage Full ventilator support targeting normal ABG Continue to titrate phenylephrine to keep MAP>65 Rocephin for E coli pending sensitivities.  Correct coagulopathy as needed.   Best practice:  Diet: NPO Pain/Anxiety/Delirium protocol (if indicated): none VAP protocol (if indicated): bundle  DVT prophylaxis: none given coagulopathy. GI prophylaxis: protonix Glucose control: episodes of hypoglycemia consistent with hepatic failure. Mobility: bedrest. Code Status: DNR Family Communication: Dr Roda ShuttersXu has updated family. Disposition: Prognosis is dismal both from neurologic and liver failure standpoint. Patient may continue to deteriorate.    Critical care time: 40 min spent including review of all clinical data including imaging, examination of the patient and adjustment of vasopressors and mechanical ventilation.     Lynnell Catalanavi Merlinda Wrubel, MD [facility] ICU Physician [facility]  Pager: [phone] Mobile: [phone] After hours: [phone].  01/25/2018, 5:32 PM

## 2018-01-26 DIAGNOSIS — Z9911 Dependence on respirator [ventilator] status: Secondary | ICD-10-CM

## 2018-01-26 DIAGNOSIS — J9601 Acute respiratory failure with hypoxia: Secondary | ICD-10-CM

## 2018-01-26 DIAGNOSIS — R6521 Severe sepsis with septic shock: Secondary | ICD-10-CM

## 2018-01-26 DIAGNOSIS — K7031 Alcoholic cirrhosis of liver with ascites: Secondary | ICD-10-CM

## 2018-01-26 DIAGNOSIS — A4151 Sepsis due to Escherichia coli [E. coli]: Secondary | ICD-10-CM

## 2018-01-26 DIAGNOSIS — E162 Hypoglycemia, unspecified: Secondary | ICD-10-CM

## 2018-01-26 LAB — PREPARE PLATELET PHERESIS
Unit division: 0
Unit division: 0

## 2018-01-26 LAB — CBC
HCT: 22.7 % — ABNORMAL LOW (ref 36.0–46.0)
Hemoglobin: 8.3 g/dL — ABNORMAL LOW (ref 12.0–15.0)
MCH: 33.1 pg (ref 26.0–34.0)
MCHC: 36.6 g/dL — ABNORMAL HIGH (ref 30.0–36.0)
MCV: 90.4 fL (ref 80.0–100.0)
Platelets: 82 10*3/uL — ABNORMAL LOW (ref 150–400)
RBC: 2.51 MIL/uL — ABNORMAL LOW (ref 3.87–5.11)
RDW: 20.3 % — ABNORMAL HIGH (ref 11.5–15.5)
WBC: 10.1 10*3/uL (ref 4.0–10.5)
nRBC: 0.2 % (ref 0.0–0.2)

## 2018-01-26 LAB — CULTURE, BLOOD (ROUTINE X 2)
SPECIAL REQUESTS: ADEQUATE
Special Requests: ADEQUATE

## 2018-01-26 LAB — BPAM PLATELET PHERESIS
Blood Product Expiration Date: 202001012359
Blood Product Expiration Date: 202001012359
ISSUE DATE / TIME: 201912302220
ISSUE DATE / TIME: 201912310032
Unit Type and Rh: 5100
Unit Type and Rh: 5100

## 2018-01-26 LAB — COMPREHENSIVE METABOLIC PANEL
ALT: 25 U/L (ref 0–44)
AST: 77 U/L — ABNORMAL HIGH (ref 15–41)
Albumin: 2.4 g/dL — ABNORMAL LOW (ref 3.5–5.0)
Alkaline Phosphatase: 75 U/L (ref 38–126)
Anion gap: 6 (ref 5–15)
BUN: 11 mg/dL (ref 6–20)
CHLORIDE: 112 mmol/L — AB (ref 98–111)
CO2: 27 mmol/L (ref 22–32)
Calcium: 8.9 mg/dL (ref 8.9–10.3)
Creatinine, Ser: 2.71 mg/dL — ABNORMAL HIGH (ref 0.44–1.00)
GFR calc Af Amer: 26 mL/min — ABNORMAL LOW (ref >60)
GFR calc non Af Amer: 22 mL/min — ABNORMAL LOW (ref >60)
Glucose, Bld: 94 mg/dL (ref 70–99)
Potassium: 3.8 mmol/L (ref 3.5–5.1)
Sodium: 145 mmol/L (ref 135–145)
Total Bilirubin: 4.7 mg/dL — ABNORMAL HIGH (ref 0.3–1.2)
Total Protein: 7.3 g/dL (ref 6.5–8.1)

## 2018-01-26 LAB — PREPARE FRESH FROZEN PLASMA
UNIT DIVISION: 0
Unit division: 0

## 2018-01-26 LAB — GLUCOSE, CAPILLARY
Glucose-Capillary: 69 mg/dL — ABNORMAL LOW (ref 70–99)
Glucose-Capillary: 90 mg/dL (ref 70–99)
Glucose-Capillary: 86 mg/dL (ref 70–99)

## 2018-01-26 LAB — BPAM FFP
Blood Product Expiration Date: 202001032359
Blood Product Expiration Date: 202001032359
ISSUE DATE / TIME: 201912301559
ISSUE DATE / TIME: 201912301725
Unit Type and Rh: 5100
Unit Type and Rh: 5100

## 2018-01-26 LAB — PROTIME-INR
INR: 1.86
Prothrombin Time: 21.2 seconds — ABNORMAL HIGH (ref 11.4–15.2)

## 2018-01-26 LAB — AMMONIA: Ammonia: 57 umol/L — ABNORMAL HIGH (ref 9–35)

## 2018-01-26 NOTE — Progress Notes (Signed)
STROKE TEAM PROGRESS NOTE   SUBJECTIVE (INTERVAL HISTORY) Her RN is at the bedside.  Patient neurologically unchanged..  Still has decerebrate posturing on pain stimulation.  Improved anemia and thrombocytopenia, however worsening coagulopathy, kidney function and ascites.  Prognosis remains poor.  In the afternoon, I met with mom and other family members at bedside.  Mom acknowledges that she is ready for her to go, but she would like to have patient daughter to visit her before making any decision.  No escalation of therapy.   OBJECTIVE Temp:  [97.2 F (36.2 C)-100.9 F (38.3 C)] 99.3 F (37.4 C) (01/01 1900) Pulse Rate:  [105-122] 111 (01/01 1900) Cardiac Rhythm: Sinus tachycardia (01/01 0800) Resp:  [23-28] 24 (01/01 1900) BP: (97-128)/(54-85) 112/66 (01/01 1900) SpO2:  [95 %-100 %] 99 % (01/01 1900) FiO2 (%):  [30 %] 30 % (01/01 1125)  Recent Labs  Lab 01/25/18 2147 01/25/18 2349 01/26/18 0403 01/26/18 0408 01/26/18 0745  GLUCAP 106* 91 69* 90 86   Recent Labs  Lab 01/23/18 2233 01/23/18 2246 01/01/2018 0516 01/18/2018 1436 01/01/2018 2107 01/25/18 0329 01/26/18 0429  NA 137  --  136  --  141 143 145  K 2.7*  --  3.0* 2.9* 2.8* 4.3 3.8  CL 108  --  109  --  108 107 112*  CO2 11*  --  8*  --  20* 22 27  GLUCOSE 104*  --  109*  --  162* 91 94  BUN <5*  --  <5*  --  <5* <5* 11  CREATININE 1.58*  --  1.32*  --  1.65* 1.83* 2.71*  CALCIUM 8.3*  --  7.5*  --  7.9* 8.7* 8.9  MG  --  1.1* 1.1* 2.3 2.1 2.1  --   PHOS  --   --  4.5  --   --  2.2*  --    Recent Labs  Lab 01/23/18 2233 01/18/2018 0516 01/25/18 0329 01/26/18 0429  AST 174* 152* 109* 77*  ALT 36 30 28 25   ALKPHOS 174* 130* 91 75  BILITOT 5.9* 5.8* 4.7* 4.7*  PROT 7.9 6.8 7.7 7.3  ALBUMIN 2.4* 2.0* 2.8* 2.4*   Recent Labs  Lab 01/15/2018 0516 12/28/2017 1830 01/03/2018 2107 01/25/18 0329 01/26/18 0429  WBC 9.3 4.2 4.8 5.7 10.1  HGB 8.5* 7.1* 6.4* 7.2* 8.3*  HCT 23.0* 19.7* 17.7* 19.7* 22.7*  MCV 91.6  88.7 88.5 88.3 90.4  PLT 53* 30* 49* 89* 82*   Recent Labs  Lab 01/23/18 2246  TROPONINI <0.03   Recent Labs    01/06/2018 0516 01/25/2018 1830 01/23/2018 2130 01/25/18 0329 01/26/18 0429  LABPROT 24.5* 25.6* 22.1* 18.3* 21.2*  INR 2.24 2.37 1.97 1.54 1.86   Recent Labs    01/23/18 2233  COLORURINE YELLOW*  LABSPEC 1.010  PHURINE 5.0  GLUCOSEU NEGATIVE  HGBUR SMALL*  BILIRUBINUR NEGATIVE  KETONESUR NEGATIVE  PROTEINUR NEGATIVE  NITRITE NEGATIVE  LEUKOCYTESUR NEGATIVE       Component Value Date/Time   TRIG 602 (H) 01/14/2018 0516   No results found for: HGBA1C    Component Value Date/Time   LABOPIA NONE DETECTED 01/06/2018 1104   COCAINSCRNUR NONE DETECTED 01/08/2018 1104   LABBENZ POSITIVE (A) 12/27/2017 1104   AMPHETMU NONE DETECTED 01/23/2018 1104   THCU NONE DETECTED 12/31/2017 1104   LABBARB NONE DETECTED 01/22/2018 1104    Recent Labs  Lab 01/23/18 2233  ETH 160*    I have personally reviewed the radiological images  below and agree with the radiology interpretations.  Ct Head Wo Contrast  Result Date: 01/09/2018 CLINICAL DATA:  Altered mental status.  Alcohol abuse.  Cirrhosis. EXAM: CT HEAD WITHOUT CONTRAST TECHNIQUE: Contiguous axial images were obtained from the base of the skull through the vertex without intravenous contrast. COMPARISON:  None. FINDINGS: Brain: There is moderately large volume acute hemorrhage in the lateral and third ventricles with small volume blood in the fourth ventricle. The lateral ventricles are severely dilated with periventricular hypoattenuation consistent with acute hydrocephalus and transependymal CSF flow. The third ventricle is also dilated. There is a 6 mm focus of hemorrhage which appears to be within the medial right thalamus, immediately adjacent to blood products in the third ventricle. There is mild surrounding edema in the right thalamus. There is no midline shift, however the cerebral sulci are diffusely effaced, and  there is effacement of the basilar cisterns. There is no cerebellar tonsillar herniation. No acute large territory vascular infarct is identified. There is no extra-axial fluid collection. Vascular: No hyperdense vessel. Skull: No fracture or focal osseous lesion. Sinuses/Orbits: Small volume bubbly secretions in the left sphenoid sinus. Clear mastoid air cells. Unremarkable included orbits. Other: None. IMPRESSION: 1. Small acute right thalamic hemorrhage with intraventricular extension. Moderately large volume of intraventricular blood with severe hydrocephalus. 2. Diffuse cerebral sulcal and basilar cistern effacement. No midline shift or tonsillar herniation. Critical Value/emergent results were called by telephone at the time of interpretation on 01/20/2018 at 2:30 pm to Dr. Hermelinda Dellen , who verbally acknowledged these results. Electronically Signed   By: Logan Bores M.D.   On: 01/10/2018 14:34   Portable Chest Xray  Result Date: 01/25/2018 CLINICAL DATA:  Endotracheal tube in place. Intracranial hemorrhage with hydrocephalus. EXAM: PORTABLE CHEST 1 VIEW COMPARISON:  One-view chest x-ray 01/16/2018 FINDINGS: The heart size is exaggerated by low lung volumes. The endotracheal tube terminates 1.5 cm above the carina and could be pulled back 1-2 cm for more optimal positioning. Increasing interstitial edema is present. No significant airspace consolidation is present. Visualized soft tissues and bony thorax are unremarkable. IMPRESSION: 1. Increasing interstitial edema, neurogenic. 2. Endotracheal tube terminates only 1.5 cm from the carina. Electronically Signed   By: San Morelle M.D.   On: 01/25/2018 06:55   Dg Chest Portable 1 View  Result Date: 01/21/2018 CLINICAL DATA:  Altered mental status tachycardia EXAM: PORTABLE CHEST 1 VIEW COMPARISON:  None. FINDINGS: Normal heart size. Normal mediastinal contour. No pneumothorax. No pleural effusion. Low lung volumes. No pulmonary edema. No  acute consolidative airspace disease. IMPRESSION: Low lung volumes with no active cardiopulmonary disease. Electronically Signed   By: Ilona Sorrel M.D.   On: 12/29/2017 02:58   Dg Abd 1 View  Result Date: 01/25/2018 CLINICAL DATA:  Abdominal distension EXAM: ABDOMEN - 1 VIEW COMPARISON:  None. FINDINGS: Nasogastric tube with the tip projecting over the stomach. There is a relative paucity of bowel gas. There is no evidence of pneumoperitoneum, portal venous gas or pneumatosis. There are no pathologic calcifications along the expected course of the ureters. Right femoral central venous catheter with the tip projecting over the right lateral margin of L4-5 disc space. The osseous structures are unremarkable. IMPRESSION: Nasogastric tube with the tip projecting over the stomach. Electronically Signed   By: Kathreen Devoid   On: 01/25/2018 16:49   Ct Head Wo Contrast  Result Date: 01/25/2018 CLINICAL DATA:  Intracranial hemorrhage. EXAM: CT HEAD WITHOUT CONTRAST TECHNIQUE: Contiguous axial images were obtained from the  base of the skull through the vertex without intravenous contrast. COMPARISON:  CT head without contrast 01/17/2018. FINDINGS: Brain: A right frontal ventriculostomy catheter is now in place. There is partial decompression of the lateral ventricles. Extensive hyperdense hemorrhage is present in the third ventricle. There is layering hemorrhage in the posterior fourth ventricles bilaterally. Extensive periventricular hypoattenuation is present surrounding the lateral ventricles bilaterally. Hemorrhage can be seen through the aqueduct of Sylvius into the fourth ventricle. Brainstem and cerebellum are otherwise normal. Hypoattenuation of the right internal capsule is concerning for acute infarct. Cerebellar tonsils are within normal limits. There is no downward herniation. Vascular: No hyperdense vessel or unexpected calcification. Skull: Calvarium is intact. Right frontal burr hole is noted.  Calvarium is otherwise intact. Subcutaneous gas is present along the course of the right ventriculostomy catheter. Sinuses/Orbits: The paranasal sinuses and mastoid air cells are clear. Globes and orbits are within normal limits bilaterally. IMPRESSION: 1. Interval placement of right frontal ventriculostomy catheter. 2. Partial decompression lateral ventricles, decreased in size from prior study. 3. Diffuse hyperdense hemorrhage again seen in the third ventricle. 4. Layering hemorrhage in the posterior horns of the lateral ventricles bilaterally. 5. Extensive periventricular edema or transependymal CSF flow is again seen about the lateral ventricles bilaterally. 6. Hemorrhage extends through the aqueduct of Sylvius into the fourth ventricle. 7. Probable infarct involving the right internal capsule. Electronically Signed   By: San Morelle M.D.   On: 01/25/2018 14:54   Portable Chest Xray  Result Date: 01/25/2018 CLINICAL DATA:  Endotracheal tube in place. Intracranial hemorrhage with hydrocephalus. EXAM: PORTABLE CHEST 1 VIEW COMPARISON:  One-view chest x-ray 01/18/2018 FINDINGS: The heart size is exaggerated by low lung volumes. The endotracheal tube terminates 1.5 cm above the carina and could be pulled back 1-2 cm for more optimal positioning. Increasing interstitial edema is present. No significant airspace consolidation is present. Visualized soft tissues and bony thorax are unremarkable. IMPRESSION: 1. Increasing interstitial edema, neurogenic. 2. Endotracheal tube terminates only 1.5 cm from the carina. Electronically Signed   By: San Morelle M.D.   On: 01/25/2018 06:55     PHYSICAL EXAM  Temp:  [97.2 F (36.2 C)-100.9 F (38.3 C)] 99.3 F (37.4 C) (01/01 1900) Pulse Rate:  [105-122] 111 (01/01 1900) Resp:  [23-28] 24 (01/01 1900) BP: (97-128)/(54-85) 112/66 (01/01 1900) SpO2:  [95 %-100 %] 99 % (01/01 1900) FiO2 (%):  [30 %] 30 % (01/01 1125)  General - Well nourished,  well developed, intubated and not responsive.  Ophthalmologic - fundi not visualized due to noncooperation.  Cardiovascular - Regular rate and rhythm, but tachycardia.  Abdomen -abdomen distention, tense on palpation, percussion absence of gas pattern, bowel sounds absent  Neuro - intubated on low dose sedation, not responsive. Eyes closed, with eye forced open, left pupil 82m and right pupil 371m not reactive to light, no doll's eyes, not blinking to visual threat. Left corneal absent but right corneal weakness present. Gag present. With pain stimulation, left UE slight withdraw and then became decerebrate posturing. Increased muscle tone in all 4 extremities with stimulation. No movement on RUE and BLE with pain, babainski positive bilaterally. Sensation, coordination and gait not tested.   ASSESSMENT/PLAN Ms. EbDyamon Sosinskis a [age].o. female with history of alcoholic cirrhosis, ascites admitted for confusion. Found to have acidosis, sepsis, coagulopathy, thrombocytopenia, anemia and AKI. 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 No tPA given due to ICPeavine   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 severe hydrocephalus, likely due to severe coagulopathy and thrombocytopenia 2/2 decompensated alcoholic dirrhosis  Resultant intubated and unresponsive  CT head right thalamic small ICH with extensive IVH and severe hydrocephalus  NSG on board and EVD placed  CT repeat improved hydrocephalus, stable ICH and IVH, however extensive periventricular edema or transependymal CSF flow  SCDs for VTE prophylaxis  No antithrombotic prior to admission, now on No antithrombotic due to [place]  Ongoing aggressive stroke risk factor management  Disposition:  Pending, I had long discussion with mom at bedside, she would like patient daughter to see patient before making decision.  However agree with no escalation of care.  CODE STATUS DNR  Severe  hydrocephalus  CT showed severe dilated ventricles  NSG on board  S/p EVD on drainage  Repeat CT improved hydrocephalus but still extensive periventricular edema and transependymal CSF flow  Clinically no significant improvement  Poor prognosis  Mom is pending for further decision  No escalation of care  Alcoholic cirrhosis, decompensated  Coagulopathy - INR 2.24->2.37->FFP->1.97->FFP->1.54-> 1.86, no FFP at this moment as no escalation of care, continue monitor INR  Thrombocytopenia - platelet 53->30->49->transfusion->89-> 82, stable  Anemia Hb 6.4->PRBC->7.2-> 8.3, stable  Significant ascetics present, no paracentesis due to low platelet and coagulopathy  Hyperammonemia 57, continue monitoring  Hx of alcoholic   Home meds -  Lasix, lactulose, nadolol, spironolactone, bactrim  No escalation of care as discussed with mom  Sepsis with bacteremia   Hypotension  Lactic acid 10.7->12.8  CCM on board  Blood culture grew GNR - E. Coli, sensitive to Rocephin  Continue Rocephin  UA negative  WBC 4.8->5.7-> 10.1, continue monitoring  Tachycardia  Hypotension  stable on the low end  Off Neo  BP goal 110 - 140  Hypoglycemia  Glucose 70-100  On D10 @ 60 cc  Continue CBC monitoring  Tobacco abuse  Current smoker  Smoking cessation counseling will be provided if appropriate  Other Active Problems  Severe malnutrition   Decerebrate posturing  Tachycardia  Hospital day # 2  This patient is critically ill due to [place], IVH, severe hydrocephalus, sepsis, bacteremia, decompensated cirrhosis, coagulopathy, anemia, thrombocytopenia, hypotension, hypoglycemia and at significant risk of neurological worsening, death form recurrent ICH, IVH, worsening hydrocephalus, seizure, brain herniation, brain death, vegetative state, bleeding, septic shock. This patient's care requires constant monitoring of vital signs, hemodynamics, respiratory and cardiac  monitoring, review of multiple databases, neurological assessment, discussion with family, other specialists and medical decision making of high complexity. I spent 45 minutes of neurocritical care time in the care of this patient. I had long discussion with mom at bedside, updated pt current condition, treatment plan and poor prognosis. She expressed understanding and appreciation.    Rosalin Hawking, MD PhD Stroke Neurology 01/26/2018 7:30 PM    To contact Stroke Continuity provider, please refer to [email or web address] After hours, contact General Neurology

## 2018-01-26 NOTE — Progress Notes (Signed)
OT Cancellation Note  Patient Details Name: Thembi Mcfarlain MRN: [id number] DOB: 01-31-1985   Cancelled Treatment:    Reason Eval/Treat Not Completed: Active bedrest order;Patient not medically ready. OT will continue to follow for medical appropriateness.   Evern Bio Sorayah Schrodt 01/26/2018, 8:23 AM  Sherryl Manges OTR/L Acute Rehabilitation Services Pager: [phone] Office: [phone]

## 2018-01-26 NOTE — Progress Notes (Signed)
Subjective: The patient is sedated, intubated, and in no apparent distress.  Her uncle is at the bedside.  Objective: Vital signs in last 24 hours: Temp:  [97.5 F (36.4 C)-101.7 F (38.7 C)] 97.5 F (36.4 C) (01/01 0700) Pulse Rate:  [106-138] 109 (01/01 0754) Resp:  [23-39] 26 (01/01 0700) BP: (86-134)/(44-85) 105/74 (01/01 0754) SpO2:  [95 %-100 %] 99 % (01/01 0700) FiO2 (%):  [30 %] 30 % (01/01 0755) Estimated body mass index is 23.99 kg/m as calculated from the following:   Height as of 01/23/18: 5\' 2"  (1.575 m).   Weight as of an earlier encounter on 02/15/18: 59.5 kg.   Intake/Output from previous day: 12/31 0701 - 01/01 0700 In: 1441.5 [I.V.:1341.5; IV Piggyback:100] Out: 481 [Urine:275; Drains:206] Intake/Output this shift: No intake/output data recorded.  Physical exam Glascow coma scale 9 intubated, [id number].  Her eyes are open.  She abnormally flexes bilaterally to pain.  Her pupils approximately 5 mm left approximately 3 mm.  The ventriculostomy is patent and draining.  Lab Results: Recent Labs    01/25/18 0329 01/26/18 0429  WBC 5.7 10.1  HGB 7.2* 8.3*  HCT 19.7* 22.7*  PLT 89* 82*   BMET Recent Labs    01/25/18 0329 01/26/18 0429  NA 143 145  K 4.3 3.8  CL 107 112*  CO2 22 27  GLUCOSE 91 94  BUN <5* 11  CREATININE 1.83* 2.71*  CALCIUM 8.7* 8.9    Studies/Results: Dg Abd 1 View  Result Date: 01/25/2018 CLINICAL DATA:  Abdominal distension EXAM: ABDOMEN - 1 VIEW COMPARISON:  None. FINDINGS: Nasogastric tube with the tip projecting over the stomach. There is a relative paucity of bowel gas. There is no evidence of pneumoperitoneum, portal venous gas or pneumatosis. There are no pathologic calcifications along the expected course of the ureters. Right femoral central venous catheter with the tip projecting over the right lateral margin of L4-5 disc space. The osseous structures are unremarkable. IMPRESSION: Nasogastric tube with the tip projecting  over the stomach. Electronically Signed   By: Elige Ko   On: 01/25/2018 16:49   Ct Head Wo Contrast  Result Date: 01/25/2018 CLINICAL DATA:  Intracranial hemorrhage. EXAM: CT HEAD WITHOUT CONTRAST TECHNIQUE: Contiguous axial images were obtained from the base of the skull through the vertex without intravenous contrast. COMPARISON:  CT head without contrast 02/15/2018. FINDINGS: Brain: A right frontal ventriculostomy catheter is now in place. There is partial decompression of the lateral ventricles. Extensive hyperdense hemorrhage is present in the third ventricle. There is layering hemorrhage in the posterior fourth ventricles bilaterally. Extensive periventricular hypoattenuation is present surrounding the lateral ventricles bilaterally. Hemorrhage can be seen through the aqueduct of Sylvius into the fourth ventricle. Brainstem and cerebellum are otherwise normal. Hypoattenuation of the right internal capsule is concerning for acute infarct. Cerebellar tonsils are within normal limits. There is no downward herniation. Vascular: No hyperdense vessel or unexpected calcification. Skull: Calvarium is intact. Right frontal burr hole is noted. Calvarium is otherwise intact. Subcutaneous gas is present along the course of the right ventriculostomy catheter. Sinuses/Orbits: The paranasal sinuses and mastoid air cells are clear. Globes and orbits are within normal limits bilaterally. IMPRESSION: 1. Interval placement of right frontal ventriculostomy catheter. 2. Partial decompression lateral ventricles, decreased in size from prior study. 3. Diffuse hyperdense hemorrhage again seen in the third ventricle. 4. Layering hemorrhage in the posterior horns of the lateral ventricles bilaterally. 5. Extensive periventricular edema or transependymal CSF flow is again seen about  the lateral ventricles bilaterally. 6. Hemorrhage extends through the aqueduct of Sylvius into the fourth ventricle. 7. Probable infarct involving  the right internal capsule. Electronically Signed   By: Marin Roberts M.D.   On: 01/25/2018 14:54   Ct Head Wo Contrast  Result Date: 02/19/18 CLINICAL DATA:  Altered mental status.  Alcohol abuse.  Cirrhosis. EXAM: CT HEAD WITHOUT CONTRAST TECHNIQUE: Contiguous axial images were obtained from the base of the skull through the vertex without intravenous contrast. COMPARISON:  None. FINDINGS: Brain: There is moderately large volume acute hemorrhage in the lateral and third ventricles with small volume blood in the fourth ventricle. The lateral ventricles are severely dilated with periventricular hypoattenuation consistent with acute hydrocephalus and transependymal CSF flow. The third ventricle is also dilated. There is a 6 mm focus of hemorrhage which appears to be within the medial right thalamus, immediately adjacent to blood products in the third ventricle. There is mild surrounding edema in the right thalamus. There is no midline shift, however the cerebral sulci are diffusely effaced, and there is effacement of the basilar cisterns. There is no cerebellar tonsillar herniation. No acute large territory vascular infarct is identified. There is no extra-axial fluid collection. Vascular: No hyperdense vessel. Skull: No fracture or focal osseous lesion. Sinuses/Orbits: Small volume bubbly secretions in the left sphenoid sinus. Clear mastoid air cells. Unremarkable included orbits. Other: None. IMPRESSION: 1. Small acute right thalamic hemorrhage with intraventricular extension. Moderately large volume of intraventricular blood with severe hydrocephalus. 2. Diffuse cerebral sulcal and basilar cistern effacement. No midline shift or tonsillar herniation. Critical Value/emergent results were called by telephone at the time of interpretation on 02/19/18 at 2:30 pm to Dr. Tora Kindred , who verbally acknowledged these results. Electronically Signed   By: Sebastian Ache M.D.   On: 02/19/18 14:34    Portable Chest Xray  Result Date: 01/25/2018 CLINICAL DATA:  Endotracheal tube in place. Intracranial hemorrhage with hydrocephalus. EXAM: PORTABLE CHEST 1 VIEW COMPARISON:  One-view chest x-ray 02/19/2018 FINDINGS: The heart size is exaggerated by low lung volumes. The endotracheal tube terminates 1.5 cm above the carina and could be pulled back 1-2 cm for more optimal positioning. Increasing interstitial edema is present. No significant airspace consolidation is present. Visualized soft tissues and bony thorax are unremarkable. IMPRESSION: 1. Increasing interstitial edema, neurogenic. 2. Endotracheal tube terminates only 1.5 cm from the carina. Electronically Signed   By: Marin Roberts M.D.   On: 01/25/2018 06:55    Assessment/Plan: Basal ganglia hemorrhage, intraventricular hemorrhage, hydrocephalus: We will continue supportive care and her ventriculostomy.  LOS: 2 days     Cristi Loron 01/26/2018, 8:37 AM

## 2018-01-26 NOTE — Progress Notes (Addendum)
NAME:  Janet Weiss, MRN:  [id number], DOB:  1985-10-10, LOS: 2 ADMISSION DATE:  01/06/2018, CONSULTATION DATE:  12/30 REFERRING MD:  Dr. Joanna Hews , CHIEF COMPLAINT:  Abd pain/swelling    Brief History   33 year old woman with alcoholic cirrhosis admitted with progressive obtundation. 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  Decompensated liver failure.  Coagulopathy corrected 12/30 and EVD placed.  Past Medical History  ETOH abuse Ascites Cirrhosis GERD  Significant Hospital Events   12/30 Admit with ICH 12/31 DNR established   Consults:  NSGY  PCCM   Procedures:  R Fem TLC 12/30 >>  IVC 12/31 ([place]) >>  Significant Diagnostic Tests:  CT Head 12/30 >> small right thalamic hemorrhage with intraventricular extension with mod-large blood volume, severe hydrocephalus CT Head 12/31 >> IVC in place, partial decompression of lateral ventricles, diffuse hyperdense hemorrhage in the third ventricle, layering hemorrhage in posterior horns of lateral ventricles bilaterally, extensive periventricular edema, probable infarct involving the R internal capsule  Micro Data:  BCx2 12/30 [facility]) >> E-coli >>   Antimicrobials:  Rocephin 12/31 >>    Interim history/subjective:  RN reports no acute events. Pt remains on fentanyl + neosynephrine at 68mcg's.  Neo turned off ~ 1040.    Objective   Blood pressure 101/63, pulse (!) 110, temperature (!) 97.2 F (36.2 C), resp. rate (!) 26, SpO2 99 %.    Vent Mode: PRVC FiO2 (%):  [30 %] 30 % Set Rate:  [26 bmp] 26 bmp Vt Set:  [420 mL] 420 mL PEEP:  [5 cmH20] 5 cmH20 Plateau Pressure:  [13 cmH20-25 cmH20] 22 cmH20   Intake/Output Summary (Last 24 hours) at 01/26/2018 1027 Last data filed at 01/26/2018 1000 Gross per 24 hour  Intake 1599.44 ml  Output 406 ml  Net 1193.44 ml   There were no vitals filed for this visit.  Examination: General: young adult female lying in bed on vent, appears critically ill  HEENT: MM  pink/moist, ETT, scleral icterus Neuro: sedate, downward left gaze deviation,  pupils 2-72mm sluggish, internal rotation of RUE with stimulation, down pointing toes with stimulation, does not wake/follow commands  CV: s1s2 rrr, no m/r/g PULM: even/non-labored, lungs bilaterally clear  GI: protuberant, hypoactive bowel sounds  Extremities: warm/dry, trace to 1+ generalized edema  Skin: no rashes or lesions  Resolved Hospital Problem list     Assessment & Plan:   Thalamic ICH  -s/p IVC placement 1/31  P: NSGY / Neurology following, appreciate input  Continue IVC drainage, care per protocol  Further neuroimaging per NSGY / Neurology  Acute Respiratory Failure in setting of ICH, inability to protect airway  P: PRVC 8cc/kg  Adjust rate to 24 Wean PEEP / FiO2 for sats >90% Follow intermittent CXR  Would not recommend trach  E-Coli Bacteremia with Septic Shock  -BC from [facility] -suspected SBP as source  P: Follow culture results, await sensitivities Repeat blood cultures  Wean neosynephrine off for MAP >65  Decompensated Hepatic Failure  P: Monitor LFT's, coags Correct coagulopathy as indicated   AKI  -suspect secondary to liver failure, shock from sepsis P: Trend BMP / urinary output Replace electrolytes as indicated Avoid nephrotoxic agents, ensure adequate renal perfusion  Hypoglycemia  -in setting of hepatic failure  P: Monitor glucose closely  Dextrose as needed   Coagulopathy  -in setting of liver disease P: Trend coags  Monitor for bleeding    Best practice:  Diet: NPO Pain/Anxiety/Delirium protocol (if indicated): fentanyl  VAP  protocol (if indicated): in place  DVT prophylaxis: coagulopathy / SCD's  GI prophylaxis: PPI  Glucose control: n/a Mobility: bedrest  Code Status: DNR  Family Communication: No family at bedside 1/1 am on NP rounds. Per conversation with Dr. Roda ShuttersXu > family is not in agreement with plan moving forward.  There is a brother in jail  who wants "everything done, we can take care of her".  The family that has been present initially did not have input.  Mother reports pt had been stating she was going to die recently.  She would want to honor the patients wishes.   Disposition: ICU.  Poor prognosis for meaningful recovery.    Labs   CBC: Recent Labs  Lab 01/17/2018 0516 01/02/2018 1830 12/30/2017 2107 01/25/18 0329 01/26/18 0429  WBC 9.3 4.2 4.8 5.7 10.1  HGB 8.5* 7.1* 6.4* 7.2* 8.3*  HCT 23.0* 19.7* 17.7* 19.7* 22.7*  MCV 91.6 88.7 88.5 88.3 90.4  PLT 53* 30* 49* 89* 82*    Basic Metabolic Panel: Recent Labs  Lab 01/23/18 2233 01/23/18 2246 12/30/2017 0516 01/20/2018 1436 01/21/2018 2107 01/25/18 0329 01/26/18 0429  NA 137  --  136  --  141 143 145  K 2.7*  --  3.0* 2.9* 2.8* 4.3 3.8  CL 108  --  109  --  108 107 112*  CO2 11*  --  8*  --  20* 22 27  GLUCOSE 104*  --  109*  --  162* 91 94  BUN <5*  --  <5*  --  <5* <5* 11  CREATININE 1.58*  --  1.32*  --  1.65* 1.83* 2.71*  CALCIUM 8.3*  --  7.5*  --  7.9* 8.7* 8.9  MG  --  1.1* 1.1* 2.3 2.1 2.1  --   PHOS  --   --  4.5  --   --  2.2*  --    GFR: Estimated Creatinine Clearance: 23.1 mL/min (A) (by C-G formula based on SCr of 2.71 mg/dL (H)). Recent Labs  Lab 01/03/2018 0237 01/03/2018 0516 12/27/2017 0802 01/21/2018 1830 01/11/2018 2107 01/25/18 0329 01/25/18 1715 01/26/18 0429  WBC  --  9.3  --  4.2 4.8 5.7  --  10.1  LATICACIDVEN 12.8* 12.5* 10.7*  --   --   --  3.8*  --     Liver Function Tests: Recent Labs  Lab 01/23/18 2233 01/23/2018 0516 01/25/18 0329 01/26/18 0429  AST 174* 152* 109* 77*  ALT 36 30 28 25   ALKPHOS 174* 130* 91 75  BILITOT 5.9* 5.8* 4.7* 4.7*  PROT 7.9 6.8 7.7 7.3  ALBUMIN 2.4* 2.0* 2.8* 2.4*   Recent Labs  Lab 01/23/18 2233  LIPASE 48   Recent Labs  Lab 01/23/18 2307 01/26/18 0429  AMMONIA 36* 57*    ABG    Component Value Date/Time   PHART 7.550 (H) 01/25/2018 0445   PCO2ART 24.5 (L) 01/25/2018 0445   PO2ART  93.0 01/25/2018 0445   HCO3 21.4 01/25/2018 0445   TCO2 22 01/25/2018 0445   ACIDBASEDEF 2.0 12/30/2017 2041   O2SAT 98.0 01/25/2018 0445     Coagulation Profile: Recent Labs  Lab 12/28/2017 0516 12/26/2017 1830 01/18/2018 2130 01/25/18 0329 01/26/18 0429  INR 2.24 2.37 1.97 1.54 1.86    Cardiac Enzymes: Recent Labs  Lab 01/23/18 2246  TROPONINI <0.03    HbA1C: No results found for: HGBA1C  CBG: Recent Labs  Lab 01/25/18 2147 01/25/18 2349 01/26/18 0403 01/26/18 0408 01/26/18  0745  GLUCAP 106* 91 69* 90 86    Critical care time: 30 minutes     Canary BrimBrandi Joshuajames Moehring, NP-C [facility] Pgr: [phone] or if no answer [phone] 01/26/2018, 10:27 AM

## 2018-01-26 NOTE — Progress Notes (Signed)
Spoke with mother on the phone. States she plans to arrive around 2pm tomorrow (02/02/2018) for likely terminal extubation.

## 2018-01-26 NOTE — Progress Notes (Signed)
Chaplain revisited pt's room for follow-up. Patient's mother, oldest sister, and brother's s.o. was present bedside.  Mother tearfully acknowledged patient was breathing only with help of machine, and said she was ready to let her go but..that others in family needed more time.  Patient's mother discussed raising pt's three children.  Family also had an accident this week in pt's mother's [place], with children in it, so much is going on for her.  Provided ministry of presence.  Lynnell Chad Pager [phone]

## 2018-01-26 DEATH — deceased

## 2018-01-27 ENCOUNTER — Inpatient Hospital Stay (HOSPITAL_COMMUNITY): Payer: Medicaid Other

## 2018-01-27 DIAGNOSIS — D696 Thrombocytopenia, unspecified: Secondary | ICD-10-CM

## 2018-01-27 DIAGNOSIS — J9601 Acute respiratory failure with hypoxia: Secondary | ICD-10-CM

## 2018-01-27 DIAGNOSIS — I615 Nontraumatic intracerebral hemorrhage, intraventricular: Principal | ICD-10-CM

## 2018-01-27 DIAGNOSIS — Z978 Presence of other specified devices: Secondary | ICD-10-CM

## 2018-01-27 LAB — PROTIME-INR
INR: 1.78
Prothrombin Time: 20.5 seconds — ABNORMAL HIGH (ref 11.4–15.2)

## 2018-01-27 LAB — CBC
HCT: 22.7 % — ABNORMAL LOW (ref 36.0–46.0)
Hemoglobin: 8.2 g/dL — ABNORMAL LOW (ref 12.0–15.0)
MCH: 32.9 pg (ref 26.0–34.0)
MCHC: 36.1 g/dL — ABNORMAL HIGH (ref 30.0–36.0)
MCV: 91.2 fL (ref 80.0–100.0)
PLATELETS: 54 10*3/uL — AB (ref 150–400)
RBC: 2.49 MIL/uL — ABNORMAL LOW (ref 3.87–5.11)
RDW: 21.3 % — ABNORMAL HIGH (ref 11.5–15.5)
WBC: 4.4 10*3/uL (ref 4.0–10.5)
nRBC: 0 % (ref 0.0–0.2)

## 2018-01-27 LAB — COMPREHENSIVE METABOLIC PANEL
ALT: 24 U/L (ref 0–44)
AST: 74 U/L — ABNORMAL HIGH (ref 15–41)
Albumin: 2.1 g/dL — ABNORMAL LOW (ref 3.5–5.0)
Alkaline Phosphatase: 74 U/L (ref 38–126)
Anion gap: 8 (ref 5–15)
BUN: 20 mg/dL (ref 6–20)
CO2: 23 mmol/L (ref 22–32)
Calcium: 8.4 mg/dL — ABNORMAL LOW (ref 8.9–10.3)
Chloride: 115 mmol/L — ABNORMAL HIGH (ref 98–111)
Creatinine, Ser: 3.06 mg/dL — ABNORMAL HIGH (ref 0.44–1.00)
GFR calc Af Amer: 22 mL/min — ABNORMAL LOW (ref >60)
GFR calc non Af Amer: 19 mL/min — ABNORMAL LOW (ref >60)
Glucose, Bld: 357 mg/dL — ABNORMAL HIGH (ref 70–99)
Potassium: 3.4 mmol/L — ABNORMAL LOW (ref 3.5–5.1)
Sodium: 146 mmol/L — ABNORMAL HIGH (ref 135–145)
Total Bilirubin: 5.9 mg/dL — ABNORMAL HIGH (ref 0.3–1.2)
Total Protein: 6.9 g/dL (ref 6.5–8.1)

## 2018-01-27 LAB — GLUCOSE, CAPILLARY: GLUCOSE-CAPILLARY: 116 mg/dL — AB (ref 70–99)

## 2018-01-27 LAB — AMMONIA: Ammonia: 58 umol/L — ABNORMAL HIGH (ref 9–35)

## 2018-01-27 MED ORDER — MORPHINE BOLUS VIA INFUSION
5.0000 mg | INTRAVENOUS | Status: DC | PRN
  Filled 2018-01-27: qty 5

## 2018-01-27 MED ORDER — GLYCOPYRROLATE 0.2 MG/ML IJ SOLN: 0.2000 mg | INTRAMUSCULAR | Status: DC | PRN

## 2018-01-27 MED ORDER — GLYCOPYRROLATE 1 MG PO TABS
1.0000 mg | ORAL_TABLET | ORAL | Status: DC | PRN
  Filled 2018-01-27: qty 1

## 2018-01-27 MED ORDER — MIDAZOLAM HCL 2 MG/2ML IJ SOLN
2.0000 mg | INTRAMUSCULAR | Status: DC | PRN
  Administered 2018-01-27 (×3): 2 mg via INTRAVENOUS
  Filled 2018-01-27 (×2): qty 4

## 2018-01-27 MED ORDER — MORPHINE 100MG IN NS 100ML (1MG/ML) PREMIX INFUSION
0.0000 mg/h | INTRAVENOUS | Status: DC
  Administered 2018-01-27: 10 mg/h via INTRAVENOUS
  Filled 2018-01-27: qty 100

## 2018-01-27 MED ORDER — MORPHINE SULFATE (PF) 2 MG/ML IV SOLN: 2.0000 mg | INTRAVENOUS | Status: DC | PRN

## 2018-01-27 MED ORDER — FENTANYL CITRATE (PF) 100 MCG/2ML IJ SOLN
100.0000 ug | Freq: Once | INTRAMUSCULAR | Status: AC
  Administered 2018-01-27: 100 ug via INTRAVENOUS

## 2018-01-28 LAB — GLUCOSE, CAPILLARY
GLUCOSE-CAPILLARY: 60 mg/dL — AB (ref 70–99)
Glucose-Capillary: 76 mg/dL (ref 70–99)
Glucose-Capillary: 170 mg/dL — ABNORMAL HIGH (ref 70–99)
Glucose-Capillary: 95 mg/dL (ref 70–99)
Glucose-Capillary: 110 mg/dL — ABNORMAL HIGH (ref 70–99)
Glucose-Capillary: 132 mg/dL — ABNORMAL HIGH (ref 70–99)
Glucose-Capillary: 119 mg/dL — ABNORMAL HIGH (ref 70–99)

## 2018-02-26 NOTE — Progress Notes (Signed)
PT Cancellation Note/ Discharge  Patient Details Name: Janet Weiss MRN: [id number] DOB: [birth year]   Cancelled Treatment:    Reason Eval/Treat Not Completed: Patient not medically ready(plans for terminal extubation today. will sign off and await new order should pt status change)   Tailor Westfall B Pattiann Solanki Feb 03, 2018, 7:44 AM  Delaney Meigs, PT Acute Rehabilitation Services Pager: [phone] Office: [phone]

## 2018-02-26 NOTE — Progress Notes (Signed)
OT Cancellation/DC Note  Patient Details Name: Janet Weiss MRN: [id number] DOB: Mar 19, 1985   Cancelled Treatment:    Reason Eval/Treat Not Completed: Plan for likely terminal extubation today.  OT will sign off.  Jeani HawkingWendi Dontrel Smethers, OTR/L Acute Rehabilitation Services Pager [phone] Office [phone]   Jeani HawkingConarpe, Micaella Gitto M 02/22/2018, 9:31 AM

## 2018-02-26 NOTE — Progress Notes (Signed)
Pt extubated using withdrawal guidelines.  RN @ bedside. 

## 2018-02-26 NOTE — Progress Notes (Signed)
Wasted 80 cc of morphine 100mg  in 100 ml of ns with Addison Naegeli.

## 2018-02-26 NOTE — Progress Notes (Signed)
Chaplain met and sat with family in the waiting room. Spoke with pt's mother and her pastor and met her grandchildren.  Offering ministry of presence and prayer.  Will be unavailable as pt is extubated, but their pastor is here. Tamsen Snider Pager [phone]

## 2018-02-26 NOTE — Progress Notes (Signed)
NAME:  Janet Weiss, MRN:  [id number], DOB:  01-19-1986, LOS: 3 ADMISSION DATE:  01/05/2018, CONSULTATION DATE:  12/30 REFERRING MD:  Dr. Joanna Hews , CHIEF COMPLAINT:  Abd pain/swelling    Brief History   33 year old woman with alcoholic cirrhosis admitted with progressive obtundation. 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  Decompensated liver failure.  Coagulopathy corrected 12/30 and EVD placed.  Past Medical History  ETOH abuse Ascites Cirrhosis GERD  Significant Hospital Events   12/30 Admit with ICH 12/31 DNR established   Consults:  NSGY  PCCM   Procedures:  R Fem TLC 12/30 >>  IVC 12/31 ([place]) >>  Significant Diagnostic Tests:  CT Head 12/30 >> small right thalamic hemorrhage with intraventricular extension with mod-large blood volume, severe hydrocephalus CT Head 12/31 >> IVC in place, partial decompression of lateral ventricles, diffuse hyperdense hemorrhage in the third ventricle, layering hemorrhage in posterior horns of lateral ventricles bilaterally, extensive periventricular edema, probable infarct involving the R internal capsule CXR 1/2>> ETT at carina   Micro Data:  BCx2 12/30 [facility]) >> E-coli >>   Antimicrobials:  Rocephin 12/31 >>    Interim history/subjective:  RN reports overnight conversation with pt. Mother RE likely plans for compassionate extubation 01/27/17 in the afternoon. No acute events overnight otherwise.   Objective   Blood pressure 102/64, pulse 79, temperature (!) 95.7 F (35.4 C), resp. rate (!) 24, SpO2 100 %.    Vent Mode: PRVC FiO2 (%):  [30 %] 30 % Set Rate:  [24 bmp-26 bmp] 24 bmp Vt Set:  [400 mL-420 mL] 400 mL PEEP:  [5 cmH20] 5 cmH20 Plateau Pressure:  [15 cmH20-20 cmH20] 15 cmH20   Intake/Output Summary (Last 24 hours) at 02/16/2018 0756 Last data filed at 02-16-2018 0700 Gross per 24 hour  Intake 1455.78 ml  Output 1043 ml  Net 412.78 ml   There were no vitals filed for this  visit.  Examination: General: young adult female, intubated, critically ill appearing  HEENT: moist mucus membranes, scant amount of pink-tinged sputum. Tongue swollen. ETT secure Neuro: sedated. 2mm pupils. Does not open eyes to noxious stimuli, dose not follow commands. Decerebrate posturing with noxious stimulation. no m/r/g PULM: no adventitious lung sounds. Trachea midline Cardiovascular: RRR no r/g/m. 2+ radial pulses bilaterally 1+ pedal pulses bilaterall  GI: tense. Hypoactive bowel sounds x 4 quadrants Extremities: Capillary refill < 3 seconds.  Skin: Warm, dry, clean intact  Resolved Hospital Problem list     Assessment & Plan:   Thalamic ICH -s/p IVC placement 12/31 P: Neuro/NSGY following, PCCM appreciates input Continue IVC care per protocol Neurodiagnostics per Neuro/NSGY Poor prognosis per neuro   Acute Respiratory failure 2/2 thalamic ICH -inability to protect airway P: Continue PRVC Currently RR 24 PEEP 5 Vt 400 FiO2 30% Adjust PEEP FiO2 for goal SpO2 >90% 1/2: ETT at carina, pull back 2cm F/u CXR after ETT adjusted Given overall prognosis, would not recommend trach    E Coli bacteremia, septic shock -BCx from [place]  P: Continue Rocephin  Neosynepherine as needed for MAP> 65. Currently off   Hepatic Failure Transaminitis, hyperammonemia, hypoalbuminemia, hyperbilirubinemia  P: Monitoring LFTs ammonmia periodically No Lactulose at this time (no excalation of care per primary) Correct coagulopathies PRN  Acute Kidney Injury -etiology likely multifactorial-- hypoperfusion related to septic shock, hepatic failure P: Worsening Cr (3.06 from 2.71) Monitor BMP, lytes Judicious use of nephrotoxic medications Trend I/O Maintain MAPs > 65 Per primary, no escalation of care (should AKI  worsen, no iHD/CRRT)  Impaired blood glucose -hepatic failure -hypoglycemia with dextrose administration causing acute hyperglycemia P: CBG Dextrose for  hypoglycemia SSI for hyperglycemia  Coagulopathy -hepatic failure P: Trend coags Correct PRN Monitor for acute hypotension, acute decline in H/H  No chemical DVT ppx   Thrombocytopenia P: Plt 54 (1/2) Transfusion not indicated at this time    Rest per primary  Best practice:  Diet: NPO Pain/Anxiety/Delirium protocol (if indicated): Fentanyl gtt VAP protocol (if indicated): Yes DVT prophylaxis: SCDs GI prophylaxis: PPI  Glucose control: Dextrose, SSI  Mobility: Bedrest Code Status: DNR Family Communication: No family at bedside. GOC per primary team. Per nursing note, possible compassionate extubation 1/2.   Disposition: Continue ICU care. Critically ill, Poor prognosis  Labs   CBC: Recent Labs  Lab 01/20/2018 1830 01/09/2018 2107 01/25/18 0329 01/26/18 0429 Jun 23, 2018 0500  WBC 4.2 4.8 5.7 10.1 4.4  HGB 7.1* 6.4* 7.2* 8.3* 8.2*  HCT 19.7* 17.7* 19.7* 22.7* 22.7*  MCV 88.7 88.5 88.3 90.4 91.2  PLT 30* 49* 89* 82* 54*    Basic Metabolic Panel: Recent Labs  Lab 01/23/18 2246 01/21/2018 0516 01/12/2018 1436 01/03/2018 2107 01/25/18 0329 01/26/18 0429 Jun 23, 2018 0500  NA  --  136  --  141 143 145 146*  K  --  3.0* 2.9* 2.8* 4.3 3.8 3.4*  CL  --  109  --  108 107 112* 115*  CO2  --  8*  --  20* 22 27 23   GLUCOSE  --  109*  --  162* 91 94 357*  BUN  --  <5*  --  <5* <5* 11 20  CREATININE  --  1.32*  --  1.65* 1.83* 2.71* 3.06*  CALCIUM  --  7.5*  --  7.9* 8.7* 8.9 8.4*  MG 1.1* 1.1* 2.3 2.1 2.1  --   --   PHOS  --  4.5  --   --  2.2*  --   --    GFR: Estimated Creatinine Clearance: 20.5 mL/min (A) (by C-G formula based on SCr of 3.06 mg/dL (H)). Recent Labs  Lab 12/27/2017 0237 01/23/2018 0516 12/30/2017 0802  01/20/2018 2107 01/25/18 0329 01/25/18 1715 01/26/18 0429 Jun 23, 2018 0500  WBC  --  9.3  --    < > 4.8 5.7  --  10.1 4.4  LATICACIDVEN 12.8* 12.5* 10.7*  --   --   --  3.8*  --   --    < > = values in this interval not displayed.    Liver Function  Tests: Recent Labs  Lab 01/23/18 2233 01/15/2018 0516 01/25/18 0329 01/26/18 0429 Jun 23, 2018 0500  AST 174* 152* 109* 77* 74*  ALT 36 30 28 25 24   ALKPHOS 174* 130* 91 75 74  BILITOT 5.9* 5.8* 4.7* 4.7* 5.9*  PROT 7.9 6.8 7.7 7.3 6.9  ALBUMIN 2.4* 2.0* 2.8* 2.4* 2.1*   Recent Labs  Lab 01/23/18 2233  LIPASE 48   Recent Labs  Lab 01/23/18 2307 01/26/18 0429 Jun 23, 2018 0500  AMMONIA 36* 57* 58*    ABG    Component Value Date/Time   PHART 7.550 (H) 01/25/2018 0445   PCO2ART 24.5 (L) 01/25/2018 0445   PO2ART 93.0 01/25/2018 0445   HCO3 21.4 01/25/2018 0445   TCO2 22 01/25/2018 0445   ACIDBASEDEF 2.0 01/01/2018 2041   O2SAT 98.0 01/25/2018 0445     Coagulation Profile: Recent Labs  Lab 01/23/2018 1830 01/21/2018 2130 01/25/18 0329 01/26/18 0429 Jun 23, 2018 0603  INR 2.37 1.97  1.54 1.86 1.78    Cardiac Enzymes: Recent Labs  Lab 01/23/18 2246  TROPONINI <0.03    HbA1C: No results found for: HGBA1C  CBG: Recent Labs  Lab 01/25/18 2147 01/25/18 2349 01/26/18 0403 01/26/18 0408 01/26/18 0745  GLUCAP 106* 91 69* 90 86    Critical care time: 35 minutes     Tessie FassGrace Justan Gaede MSN, AGACNP-BC [facility] 02/15/2018, 7:56 AM

## 2018-02-26 NOTE — Progress Notes (Signed)
Discussed withdraw of care plan with Dr. Roda ShuttersXu.  Orders for compassionate extubation placed-- comfort measures will be ensured throughout. Per conversation with Dr. Roda ShuttersXu and family, plan is to extubate patient and then have whole family present with the patient immediately following extubation.   Chaplain available for support.    Tessie FassGrace Gagandeep Kossman MSN, AGACNP-BC [facility] 02/06/2018, 4:45 PM

## 2018-02-26 NOTE — Progress Notes (Signed)
STROKE TEAM PROGRESS NOTE   SUBJECTIVE (INTERVAL HISTORY) Her RN is at the bedside.  Patient neurologically unchanged..  Still has decerebrate posturing on any stimulation. Continue to have worsening kidney function and thrombocytopenia. Coagulopathy improved some, but still significant anemia and ascites.   OBJECTIVE Temp:  [95.5 F (35.3 C)-100 F (37.8 C)] 96.3 F (35.7 C) (01/02 1200) Pulse Rate:  [76-117] 81 (01/02 1200) Cardiac Rhythm: Normal sinus rhythm (01/02 0800) Resp:  [20-24] 21 (01/02 1200) BP: (98-150)/(58-99) 150/99 (01/02 1200) SpO2:  [99 %-100 %] 99 % (01/02 1200) FiO2 (%):  [30 %] 30 % (01/02 1107)  Recent Labs  Lab 01/25/18 2349 01/26/18 0403 01/26/18 0408 01/26/18 0745 02/17/2018 1105  GLUCAP 91 69* 90 86 116*   Recent Labs  Lab 01/23/18 2246 02-08-18 0516 08-Feb-2018 1436 02-08-18 2107 01/25/18 0329 01/26/18 0429 02/20/2018 0500  NA  --  136  --  141 143 145 146*  K  --  3.0* 2.9* 2.8* 4.3 3.8 3.4*  CL  --  109  --  108 107 112* 115*  CO2  --  8*  --  20* 22 27 23   GLUCOSE  --  109*  --  162* 91 94 357*  BUN  --  <5*  --  <5* <5* 11 20  CREATININE  --  1.32*  --  1.65* 1.83* 2.71* 3.06*  CALCIUM  --  7.5*  --  7.9* 8.7* 8.9 8.4*  MG 1.1* 1.1* 2.3 2.1 2.1  --   --   PHOS  --  4.5  --   --  2.2*  --   --    Recent Labs  Lab 01/23/18 2233 02-08-2018 0516 01/25/18 0329 01/26/18 0429 02/20/2018 0500  AST 174* 152* 109* 77* 74*  ALT 36 30 28 25 24   ALKPHOS 174* 130* 91 75 74  BILITOT 5.9* 5.8* 4.7* 4.7* 5.9*  PROT 7.9 6.8 7.7 7.3 6.9  ALBUMIN 2.4* 2.0* 2.8* 2.4* 2.1*   Recent Labs  Lab 02-08-18 1830 02-08-18 2107 01/25/18 0329 01/26/18 0429 02/12/2018 0500  WBC 4.2 4.8 5.7 10.1 4.4  HGB 7.1* 6.4* 7.2* 8.3* 8.2*  HCT 19.7* 17.7* 19.7* 22.7* 22.7*  MCV 88.7 88.5 88.3 90.4 91.2  PLT 30* 49* 89* 82* 54*   Recent Labs  Lab 01/23/18 2246  TROPONINI <0.03   Recent Labs    08-Feb-2018 1830 02/08/18 2130 01/25/18 0329 01/26/18 0429  02/14/2018 0603  LABPROT 25.6* 22.1* 18.3* 21.2* 20.5*  INR 2.37 1.97 1.54 1.86 1.78   No results for input(s): COLORURINE, LABSPEC, PHURINE, GLUCOSEU, HGBUR, BILIRUBINUR, KETONESUR, PROTEINUR, UROBILINOGEN, NITRITE, LEUKOCYTESUR in the last 72 hours.  Invalid input(s): APPERANCEUR     Component Value Date/Time   TRIG 602 (H) 02/08/18 0516   No results found for: HGBA1C    Component Value Date/Time   LABOPIA NONE DETECTED 02-08-18 1104   COCAINSCRNUR NONE DETECTED February 08, 2018 1104   LABBENZ POSITIVE (A) 02-08-18 1104   AMPHETMU NONE DETECTED 2018/02/08 1104   THCU NONE DETECTED 02-08-2018 1104   LABBARB NONE DETECTED 02/08/2018 1104    Recent Labs  Lab 01/23/18 2233  ETH 160*    I have personally reviewed the radiological images below and agree with the radiology interpretations.  Ct Head Wo Contrast  Result Date: 2018-02-08 CLINICAL DATA:  Altered mental status.  Alcohol abuse.  Cirrhosis. EXAM: CT HEAD WITHOUT CONTRAST TECHNIQUE: Contiguous axial images were obtained from the base of the skull through the vertex without intravenous contrast.  COMPARISON:  None. FINDINGS: Brain: There is moderately large volume acute hemorrhage in the lateral and third ventricles with small volume blood in the fourth ventricle. The lateral ventricles are severely dilated with periventricular hypoattenuation consistent with acute hydrocephalus and transependymal CSF flow. The third ventricle is also dilated. There is a 6 mm focus of hemorrhage which appears to be within the medial right thalamus, immediately adjacent to blood products in the third ventricle. There is mild surrounding edema in the right thalamus. There is no midline shift, however the cerebral sulci are diffusely effaced, and there is effacement of the basilar cisterns. There is no cerebellar tonsillar herniation. No acute large territory vascular infarct is identified. There is no extra-axial fluid collection. Vascular: No  hyperdense vessel. Skull: No fracture or focal osseous lesion. Sinuses/Orbits: Small volume bubbly secretions in the left sphenoid sinus. Clear mastoid air cells. Unremarkable included orbits. Other: None. IMPRESSION: 1. Small acute right thalamic hemorrhage with intraventricular extension. Moderately large volume of intraventricular blood with severe hydrocephalus. 2. Diffuse cerebral sulcal and basilar cistern effacement. No midline shift or tonsillar herniation. Critical Value/emergent results were called by telephone at the time of interpretation on 01/18/2018 at 2:30 pm to Dr. Tora Kindred , who verbally acknowledged these results. Electronically Signed   By: Sebastian Ache M.D.   On: 12/29/2017 14:34   Portable Chest Xray  Result Date: 01/25/2018 CLINICAL DATA:  Endotracheal tube in place. Intracranial hemorrhage with hydrocephalus. EXAM: PORTABLE CHEST 1 VIEW COMPARISON:  One-view chest x-ray 01/08/2018 FINDINGS: The heart size is exaggerated by low lung volumes. The endotracheal tube terminates 1.5 cm above the carina and could be pulled back 1-2 cm for more optimal positioning. Increasing interstitial edema is present. No significant airspace consolidation is present. Visualized soft tissues and bony thorax are unremarkable. IMPRESSION: 1. Increasing interstitial edema, neurogenic. 2. Endotracheal tube terminates only 1.5 cm from the carina. Electronically Signed   By: Marin Roberts M.D.   On: 01/25/2018 06:55   Dg Chest Portable 1 View  Result Date: 01/16/2018 CLINICAL DATA:  Altered mental status tachycardia EXAM: PORTABLE CHEST 1 VIEW COMPARISON:  None. FINDINGS: Normal heart size. Normal mediastinal contour. No pneumothorax. No pleural effusion. Low lung volumes. No pulmonary edema. No acute consolidative airspace disease. IMPRESSION: Low lung volumes with no active cardiopulmonary disease. Electronically Signed   By: Delbert Phenix M.D.   On: 01/13/2018 02:58   Dg Abd 1 View  Result  Date: 01/25/2018 CLINICAL DATA:  Abdominal distension EXAM: ABDOMEN - 1 VIEW COMPARISON:  None. FINDINGS: Nasogastric tube with the tip projecting over the stomach. There is a relative paucity of bowel gas. There is no evidence of pneumoperitoneum, portal venous gas or pneumatosis. There are no pathologic calcifications along the expected course of the ureters. Right femoral central venous catheter with the tip projecting over the right lateral margin of L4-5 disc space. The osseous structures are unremarkable. IMPRESSION: Nasogastric tube with the tip projecting over the stomach. Electronically Signed   By: Elige Ko   On: 01/25/2018 16:49   Ct Head Wo Contrast  Result Date: 01/25/2018 CLINICAL DATA:  Intracranial hemorrhage. EXAM: CT HEAD WITHOUT CONTRAST TECHNIQUE: Contiguous axial images were obtained from the base of the skull through the vertex without intravenous contrast. COMPARISON:  CT head without contrast 01/22/2018. FINDINGS: Brain: A right frontal ventriculostomy catheter is now in place. There is partial decompression of the lateral ventricles. Extensive hyperdense hemorrhage is present in the third ventricle. There is layering hemorrhage in  the posterior fourth ventricles bilaterally. Extensive periventricular hypoattenuation is present surrounding the lateral ventricles bilaterally. Hemorrhage can be seen through the aqueduct of Sylvius into the fourth ventricle. Brainstem and cerebellum are otherwise normal. Hypoattenuation of the right internal capsule is concerning for acute infarct. Cerebellar tonsils are within normal limits. There is no downward herniation. Vascular: No hyperdense vessel or unexpected calcification. Skull: Calvarium is intact. Right frontal burr hole is noted. Calvarium is otherwise intact. Subcutaneous gas is present along the course of the right ventriculostomy catheter. Sinuses/Orbits: The paranasal sinuses and mastoid air cells are clear. Globes and orbits are  within normal limits bilaterally. IMPRESSION: 1. Interval placement of right frontal ventriculostomy catheter. 2. Partial decompression lateral ventricles, decreased in size from prior study. 3. Diffuse hyperdense hemorrhage again seen in the third ventricle. 4. Layering hemorrhage in the posterior horns of the lateral ventricles bilaterally. 5. Extensive periventricular edema or transependymal CSF flow is again seen about the lateral ventricles bilaterally. 6. Hemorrhage extends through the aqueduct of Sylvius into the fourth ventricle. 7. Probable infarct involving the right internal capsule. Electronically Signed   By: Marin Roberts M.D.   On: 01/25/2018 14:54   Portable Chest Xray  Result Date: 01/25/2018 CLINICAL DATA:  Endotracheal tube in place. Intracranial hemorrhage with hydrocephalus. EXAM: PORTABLE CHEST 1 VIEW COMPARISON:  One-view chest x-ray 01/15/2018 FINDINGS: The heart size is exaggerated by low lung volumes. The endotracheal tube terminates 1.5 cm above the carina and could be pulled back 1-2 cm for more optimal positioning. Increasing interstitial edema is present. No significant airspace consolidation is present. Visualized soft tissues and bony thorax are unremarkable. IMPRESSION: 1. Increasing interstitial edema, neurogenic. 2. Endotracheal tube terminates only 1.5 cm from the carina. Electronically Signed   By: Marin Roberts M.D.   On: 01/25/2018 06:55   Dg Chest Port 1 View  Result Date: 01/28/18 CLINICAL DATA:  Endotracheal tube placement. EXAM: PORTABLE CHEST 1 VIEW COMPARISON:  Radiograph of same day. FINDINGS: The heart size and mediastinal contours are within normal limits. Nasogastric tube is unchanged in position. Endotracheal tube tip remains just above the carina; withdrawal by 2-3 cm is recommended. No pneumothorax or pleural effusion is noted. Both lungs are clear. The visualized skeletal structures are unremarkable. IMPRESSION: Endotracheal tube tip  remains just above the carina; withdrawal by 2-3 cm is recommended. Nasogastric tube is in good position. No acute cardiopulmonary abnormality seen. These results will be called to the ordering clinician or representative by the Radiologist Assistant, and communication documented in the PACS or zVision Dashboard. Electronically Signed   By: Lupita Raider, M.D.   On: 28-Jan-2018 12:22   Dg Chest Port 1 View  Addendum Date: 01/28/18   ADDENDUM REPORT: Jan 28, 2018 08:06 ADDENDUM: ETT position discussed with inpatient RN Philis Nettle at 0800 hours. Electronically Signed   By: Odessa Fleming M.D.   On: January 28, 2018 08:06   Result Date: 01/28/2018 CLINICAL DATA:  33 year old female with respiratory failure, acute intracranial hemorrhage. Cirrhosis. EXAM: PORTABLE CHEST 1 VIEW COMPARISON:  01/25/2018 portable chest and earlier. FINDINGS: Portable AP semi upright view at 0545 hours. Endotracheal tube tip abuts the carina. The tube could be retracted 2-3 centimeters for optimal placement. Enteric tube courses to the abdomen with side hole visible at the gastric body level. Continued low lung volumes but no focal pulmonary opacity, pneumothorax, pulmonary edema or pleural effusion. Normal cardiac size and mediastinal contours. Paucity of bowel gas in the upper abdomen. No acute osseous abnormality identified. IMPRESSION: 1.  Endotracheal tube tip abuts the carina. Retract 2-3 cm for optimal positioning. 2. Enteric tube side hole at the gastric body level. 3. Low lung volumes.  No acute cardiopulmonary abnormality. Electronically Signed: By: Odessa FlemingH  Hall M.D. On: 02/08/2018 07:54     PHYSICAL EXAM  Temp:  [95.5 F (35.3 C)-100 F (37.8 C)] 96.3 F (35.7 C) (01/02 1200) Pulse Rate:  [76-117] 81 (01/02 1200) Resp:  [20-24] 21 (01/02 1200) BP: (98-150)/(58-99) 150/99 (01/02 1200) SpO2:  [99 %-100 %] 99 % (01/02 1200) FiO2 (%):  [30 %] 30 % (01/02 1107)  General - Well nourished, well developed, intubated and not  responsive.  Ophthalmologic - fundi not visualized due to noncooperation.  Cardiovascular - Regular rate and rhythm, but tachycardia.  Abdomen -abdomen distention, tense on palpation, percussion absence of gas pattern, bowel sounds absent  Neuro - intubated on low dose sedation, not responsive. Eyes closed, with eye forced open, left pupil 4mm and right pupil 4mm, not reactive to light, no doll's eyes, not blinking to visual threat. Bilateral corneal absent. Gag not able to test due to pt biting on the ET tube. With pain stimulation, pt became decerebrate posturing. Babainski positive bilaterally. Sensation, coordination and gait not able to be tested.   ASSESSMENT/PLAN Ms. Janet Weiss is a 33 y.o. female with history of alcoholic cirrhosis, ascites admitted for confusion. Found to have acidosis, sepsis, coagulopathy, thrombocytopenia, anemia and AKI. 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 No tPA given due to ICH.    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  CODE STATUS DNR  Severe hydrocephalus  CT showed severe dilated ventricles  NSG on board  S/p EVD on drainage  Repeat CT improved hydrocephalus but still extensive periventricular edema and transependymal CSF flow  Clinically no neuro improvement  Poor  prognosis  No escalation of care  Alcoholic cirrhosis, decompensated  Coagulopathy - INR 2.24->2.37->FFP->1.97->FFP->1.54-> 1.86->1.78, no FFP at this moment as no escalation of care, continue monitor INR  Thrombocytopenia - platelet 53->30->49->transfusion->89-> 82->54, stable  Anemia Hb 6.4->PRBC->7.2-> 8.3->8.2, stable  Significant ascetics present, no paracentesis due to low platelet and coagulopathy  Hyperammonemia 57->58, continue monitoring  Hx of alcoholic   Home meds -  Lasix, lactulose, nadolol, spironolactone, bactrim  No escalation of care as discussed with mom  Sepsis with bacteremia   Hypotension  Lactic acid 10.7->12.8  CCM on board  Blood culture grew GNR - E. Coli, sensitive to Rocephin  Continue Rocephin  UA negative  WBC 4.8->5.7-> 10.1->4.4, continue monitoring   Tachycardia  Hypotension  stable on the low end SBP 100s  Off Neo  BP goal 110 - 140  Hypoglycemia  Glucose 70-115  On D10 @ 60 cc  Continue CBG monitoring  Tobacco abuse  Current smoker  Smoking cessation counseling will be provided if appropriate  Other Active Problems  Severe malnutrition   Decerebrate posturing  Tachycardia  Hospital day # 3  This patient is critically ill due to ICH, IVH, severe hydrocephalus, sepsis, bacteremia, decompensated cirrhosis, coagulopathy, anemia, thrombocytopenia, hypotension, hypoglycemia and at significant risk of neurological worsening, death form recurrent ICH, IVH, worsening hydrocephalus, seizure, brain herniation, brain death, vegetative state, bleeding, septic shock. This patient's care requires constant monitoring of vital signs, hemodynamics, respiratory and cardiac monitoring, review of multiple databases, neurological assessment, discussion with family, other specialists and medical decision making of high complexity. I spent 40 minutes of neurocritical care time in the care of this patient.Marvel Plan, MD  PhD Stroke Neurology 02-08-18 1:06 PM    To contact Stroke Continuity provider, please refer to [email or web address]. After hours, contact General Neurology

## 2018-02-26 NOTE — Progress Notes (Signed)
No heart rate or respiratory rate. Verified with Yeni P.

## 2018-02-26 NOTE — Death Summary Note (Signed)
DEATH SUMMARY   Patient Details  Name: Janet Weiss MRN: [id number] DOB: Nov 12, 1985  Admission/Discharge Information   Admit Date:  2018/02/23  Date of Death: Date of Death: 26-Feb-2018  Time of Death: Time of Death: 18-Jun-1855  Length of Stay: 3  Referring Physician: Estell Harpin, MD   Reason(s) for Hospitalization  ICH with IVH  Diagnoses  Preliminary cause of death:   Obstructive hydrocephalus Right thalamic ICH IVH  Secondary Diagnoses (including complications and co-morbidities):  Active Problems:   Acute respiratory insufficiency   Lactic acidosis   Metabolic acidosis   AKI (acute kidney injury) (HCC)   Decompensated cirrhosis   Thrombocytopenia    Coagulopathy    Renal failure   hypotension   Brief Hospital Course (including significant findings, care, treatment, and services provided and events leading to death)  Janet Weiss is a 34 y.o. year old female with history of alcoholic cirrhosis, ascites admitted for confusion. Found to have acidosis, sepsis, coagulopathy, thrombocytopenia, anemia and AKI. 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 No tPA given due to ICH.    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 severe hydrocephalus, likely due to severe coagulopathy and thrombocytopenia 2/2 decompensated alcoholic dirrhosis  Resultant intubated and unresponsive  CT head right thalamic small ICH with extensive IVH and severe hydrocephalus  NSG on board and EVD placed  CT repeat improved hydrocephalus, stable ICH and IVH, however extensive periventricular edema or transependymal CSF flow  Disposition:  mom requested comfort care   CODE STATUS DNR  Pt passed away shortly after terminal extubation as mom requested  Severe hydrocephalus  CT showed severe dilated ventricles  NSG on board  S/p EVD on drainage  Repeat CT improved hydrocephalus but still extensive periventricular edema and  transependymal CSF flow  Clinically no significant improvement  Poor prognosis  Alcoholic cirrhosis, decompensated  Coagulopathy - INR 2.24->2.37->FFP->1.97->FFP->1.54-> 1.86  Thrombocytopenia - platelet 53->30->49->transfusion->89-> 82, stable  Anemia Hb 6.4->PRBC->7.2-> 8.3  Significant ascetics present, no paracentesis due to low platelet and coagulopathy  Hyperammonemia 57  Hx of alcoholic   Home meds -  Lasix, lactulose, nadolol, spironolactone, bactrim  No escalation of care as discussed with mom  Sepsis with bacteremia   Hypotension  Lactic acid 10.7->12.8  CCM on board  Blood culture grew GNR - E. Coli, sensitive to Rocephin  Continue Rocephin  UA negative  WBC 4.8->5.7-> 10.1, continue monitoring  Hypotension  stable on the low end  Off Neo  BP goal 110 - 140  Hypoglycemia  Glucose 70-100  On D10 @ 60 cc  Continue CBC monitoring  Tobacco abuse  Current smoker  Other Active Problems  Severe malnutrition   Decerebrate posturing  Tachycardia    Pertinent Labs and Studies  Significant Diagnostic Studies Dg Abd 1 View  Result Date: 01/25/2018 CLINICAL DATA:  Abdominal distension EXAM: ABDOMEN - 1 VIEW COMPARISON:  None. FINDINGS: Nasogastric tube with the tip projecting over the stomach. There is a relative paucity of bowel gas. There is no evidence of pneumoperitoneum, portal venous gas or pneumatosis. There are no pathologic calcifications along the expected course of the ureters. Right femoral central venous catheter with the tip projecting over the right lateral margin of L4-5 disc space. The osseous structures are unremarkable. IMPRESSION: Nasogastric tube with the tip projecting over the stomach. Electronically Signed   By: Elige Ko   On: 01/25/2018 16:49   Ct Head Wo Contrast  Result Date: 01/25/2018  CLINICAL DATA:  Intracranial hemorrhage. EXAM: CT HEAD WITHOUT CONTRAST TECHNIQUE: Contiguous axial images were  obtained from the base of the skull through the vertex without intravenous contrast. COMPARISON:  CT head without contrast 01/10/2018. FINDINGS: Brain: A right frontal ventriculostomy catheter is now in place. There is partial decompression of the lateral ventricles. Extensive hyperdense hemorrhage is present in the third ventricle. There is layering hemorrhage in the posterior fourth ventricles bilaterally. Extensive periventricular hypoattenuation is present surrounding the lateral ventricles bilaterally. Hemorrhage can be seen through the aqueduct of Sylvius into the fourth ventricle. Brainstem and cerebellum are otherwise normal. Hypoattenuation of the right internal capsule is concerning for acute infarct. Cerebellar tonsils are within normal limits. There is no downward herniation. Vascular: No hyperdense vessel or unexpected calcification. Skull: Calvarium is intact. Right frontal burr hole is noted. Calvarium is otherwise intact. Subcutaneous gas is present along the course of the right ventriculostomy catheter. Sinuses/Orbits: The paranasal sinuses and mastoid air cells are clear. Globes and orbits are within normal limits bilaterally. IMPRESSION: 1. Interval placement of right frontal ventriculostomy catheter. 2. Partial decompression lateral ventricles, decreased in size from prior study. 3. Diffuse hyperdense hemorrhage again seen in the third ventricle. 4. Layering hemorrhage in the posterior horns of the lateral ventricles bilaterally. 5. Extensive periventricular edema or transependymal CSF flow is again seen about the lateral ventricles bilaterally. 6. Hemorrhage extends through the aqueduct of Sylvius into the fourth ventricle. 7. Probable infarct involving the right internal capsule. Electronically Signed   By: Marin Roberts M.D.   On: 01/25/2018 14:54   Ct Head Wo Contrast  Result Date: 12/30/2017 CLINICAL DATA:  Altered mental status.  Alcohol abuse.  Cirrhosis. EXAM: CT HEAD WITHOUT  CONTRAST TECHNIQUE: Contiguous axial images were obtained from the base of the skull through the vertex without intravenous contrast. COMPARISON:  None. FINDINGS: Brain: There is moderately large volume acute hemorrhage in the lateral and third ventricles with small volume blood in the fourth ventricle. The lateral ventricles are severely dilated with periventricular hypoattenuation consistent with acute hydrocephalus and transependymal CSF flow. The third ventricle is also dilated. There is a 6 mm focus of hemorrhage which appears to be within the medial right thalamus, immediately adjacent to blood products in the third ventricle. There is mild surrounding edema in the right thalamus. There is no midline shift, however the cerebral sulci are diffusely effaced, and there is effacement of the basilar cisterns. There is no cerebellar tonsillar herniation. No acute large territory vascular infarct is identified. There is no extra-axial fluid collection. Vascular: No hyperdense vessel. Skull: No fracture or focal osseous lesion. Sinuses/Orbits: Small volume bubbly secretions in the left sphenoid sinus. Clear mastoid air cells. Unremarkable included orbits. Other: None. IMPRESSION: 1. Small acute right thalamic hemorrhage with intraventricular extension. Moderately large volume of intraventricular blood with severe hydrocephalus. 2. Diffuse cerebral sulcal and basilar cistern effacement. No midline shift or tonsillar herniation. Critical Value/emergent results were called by telephone at the time of interpretation on 01/11/2018 at 2:30 pm to Dr. Tora Kindred , who verbally acknowledged these results. Electronically Signed   By: Sebastian Ache M.D.   On: 01/20/2018 14:34   Dg Chest Port 1 View  Result Date: 01-28-2018 CLINICAL DATA:  Endotracheal tube placement. EXAM: PORTABLE CHEST 1 VIEW COMPARISON:  Radiograph of same day. FINDINGS: The heart size and mediastinal contours are within normal limits. Nasogastric tube  is unchanged in position. Endotracheal tube tip remains just above the carina; withdrawal by 2-3 cm is  recommended. No pneumothorax or pleural effusion is noted. Both lungs are clear. The visualized skeletal structures are unremarkable. IMPRESSION: Endotracheal tube tip remains just above the carina; withdrawal by 2-3 cm is recommended. Nasogastric tube is in good position. No acute cardiopulmonary abnormality seen. These results will be called to the ordering clinician or representative by the Radiologist Assistant, and communication documented in the PACS or zVision Dashboard. Electronically Signed   By: Lupita Raider, M.D.   On: February 20, 2018 12:22   Dg Chest Port 1 View  Addendum Date: 02/20/2018   ADDENDUM REPORT: 20-Feb-2018 08:06 ADDENDUM: ETT position discussed with inpatient RN Philis Nettle at 0800 hours. Electronically Signed   By: Odessa Fleming M.D.   On: 02-20-2018 08:06   Result Date: 02-20-2018 CLINICAL DATA:  33 year old female with respiratory failure, acute intracranial hemorrhage. Cirrhosis. EXAM: PORTABLE CHEST 1 VIEW COMPARISON:  01/25/2018 portable chest and earlier. FINDINGS: Portable AP semi upright view at 0545 hours. Endotracheal tube tip abuts the carina. The tube could be retracted 2-3 centimeters for optimal placement. Enteric tube courses to the abdomen with side hole visible at the gastric body level. Continued low lung volumes but no focal pulmonary opacity, pneumothorax, pulmonary edema or pleural effusion. Normal cardiac size and mediastinal contours. Paucity of bowel gas in the upper abdomen. No acute osseous abnormality identified. IMPRESSION: 1. Endotracheal tube tip abuts the carina. Retract 2-3 cm for optimal positioning. 2. Enteric tube side hole at the gastric body level. 3. Low lung volumes.  No acute cardiopulmonary abnormality. Electronically Signed: By: Odessa Fleming M.D. On: 02/20/2018 07:54   Portable Chest Xray  Result Date: 01/25/2018 CLINICAL DATA:  Endotracheal tube in  place. Intracranial hemorrhage with hydrocephalus. EXAM: PORTABLE CHEST 1 VIEW COMPARISON:  One-view chest x-ray 12/27/2017 FINDINGS: The heart size is exaggerated by low lung volumes. The endotracheal tube terminates 1.5 cm above the carina and could be pulled back 1-2 cm for more optimal positioning. Increasing interstitial edema is present. No significant airspace consolidation is present. Visualized soft tissues and bony thorax are unremarkable. IMPRESSION: 1. Increasing interstitial edema, neurogenic. 2. Endotracheal tube terminates only 1.5 cm from the carina. Electronically Signed   By: Marin Roberts M.D.   On: 01/25/2018 06:55   Dg Chest Port 1 View  Result Date: 01/16/2018 CLINICAL DATA:  Intubation EXAM: PORTABLE CHEST 1 VIEW COMPARISON:  01/25/2018 FINDINGS: Endotracheal tube tip is at the level of the clavicular heads. Side port of the nasogastric tube projects over the stomach. The heart size and mediastinal contours are within normal limits. Both lungs are clear. The visualized skeletal structures are unremarkable. IMPRESSION: ET tube tip at the level of the clavicular heads. Electronically Signed   By: Deatra Robinson M.D.   On: 01/02/2018 04:46   Dg Chest Portable 1 View  Result Date: 12/30/2017 CLINICAL DATA:  Altered mental status tachycardia EXAM: PORTABLE CHEST 1 VIEW COMPARISON:  None. FINDINGS: Normal heart size. Normal mediastinal contour. No pneumothorax. No pleural effusion. Low lung volumes. No pulmonary edema. No acute consolidative airspace disease. IMPRESSION: Low lung volumes with no active cardiopulmonary disease. Electronically Signed   By: Delbert Phenix M.D.   On: 01/04/2018 02:58    Microbiology Recent Results (from the past 240 hour(s))  Culture, blood (Routine X 2) w Reflex to ID Panel     Status: Abnormal   Collection Time: 01/19/2018  5:16 AM  Result Value Ref Range Status   Specimen Description   Final    BLOOD RIGHT ANTECUBITAL  Performed at [facility], [address] [place], [place] [address]    Special Requests   Final    BOTTLES DRAWN AEROBIC AND ANAEROBIC Blood Culture adequate volume Performed at [facility], [address] [place], [place] [address]    Culture  Setup Time   Final    GRAM NEGATIVE RODS IN BOTH AEROBIC AND ANAEROBIC BOTTLES CRITICAL RESULT CALLED TO, READ BACK BY AND VERIFIED WITH: Yves DillCHARLES SHANLEVER 12/26/2017 1616 KLW Performed at [facility], [address] [place], [place] [address]    Culture ESCHERICHIA COLI (A)  Final   Report Status 01/26/2018 FINAL  Final   Organism ID, Bacteria ESCHERICHIA COLI  Final      Susceptibility   Escherichia coli - MIC*    AMPICILLIN >=32 RESISTANT Resistant     CEFAZOLIN <=4 SENSITIVE Sensitive     CEFEPIME <=1 SENSITIVE Sensitive     CEFTAZIDIME <=1 SENSITIVE Sensitive     CEFTRIAXONE <=1 SENSITIVE Sensitive     CIPROFLOXACIN >=4 RESISTANT Resistant     GENTAMICIN <=1 SENSITIVE Sensitive     IMIPENEM <=0.25 SENSITIVE Sensitive     TRIMETH/SULFA >=320 RESISTANT Resistant     AMPICILLIN/SULBACTAM 16 INTERMEDIATE Intermediate     PIP/TAZO <=4 SENSITIVE Sensitive     Extended ESBL NEGATIVE Sensitive     * ESCHERICHIA COLI  Blood Culture ID Panel (Reflexed)     Status: Abnormal   Collection Time: 01/05/2018  5:16 AM  Result Value Ref Range Status   Enterococcus species NOT DETECTED NOT DETECTED Final   Listeria monocytogenes NOT DETECTED NOT DETECTED Final   Staphylococcus species NOT DETECTED NOT DETECTED Final   Staphylococcus aureus (BCID) NOT DETECTED NOT DETECTED Final   Streptococcus species NOT DETECTED NOT DETECTED Final   Streptococcus agalactiae NOT DETECTED NOT DETECTED Final   Streptococcus pneumoniae NOT DETECTED NOT DETECTED Final   Streptococcus pyogenes NOT DETECTED NOT DETECTED Final   Acinetobacter baumannii NOT DETECTED NOT DETECTED Final   Enterobacteriaceae species DETECTED (A) NOT DETECTED Final    Comment:  Enterobacteriaceae represent a large family of gram-negative bacteria, not a single organism. CRITICAL RESULT CALLED TO, READ BACK BY AND VERIFIED WITH: CHARLES SHANLEVER 01/13/2018 1616 KLW    Enterobacter cloacae complex NOT DETECTED NOT DETECTED Final   Escherichia coli DETECTED (A) NOT DETECTED Final    Comment: CRITICAL RESULT CALLED TO, READ BACK BY AND VERIFIED WITH: CHARLES SHANLEVER 01/02/2018 1616 KLW    Klebsiella oxytoca NOT DETECTED NOT DETECTED Final   Klebsiella pneumoniae NOT DETECTED NOT DETECTED Final   Proteus species NOT DETECTED NOT DETECTED Final   Serratia marcescens NOT DETECTED NOT DETECTED Final   Carbapenem resistance NOT DETECTED NOT DETECTED Final   Haemophilus influenzae NOT DETECTED NOT DETECTED Final   Neisseria meningitidis NOT DETECTED NOT DETECTED Final   Pseudomonas aeruginosa NOT DETECTED NOT DETECTED Final   Candida albicans NOT DETECTED NOT DETECTED Final   Candida glabrata NOT DETECTED NOT DETECTED Final   Candida krusei NOT DETECTED NOT DETECTED Final   Candida parapsilosis NOT DETECTED NOT DETECTED Final   Candida tropicalis NOT DETECTED NOT DETECTED Final    Comment: Performed at [facility], [address] [place], [place] [address]  Culture, blood (Routine X 2) w Reflex to ID Panel     Status: Abnormal   Collection Time: 01/17/2018  5:24 AM  Result Value Ref Range Status   Specimen Description   Final    BLOOD RIGHT  HAND Performed at [facility], [address], [place], [place] [address]    Special Requests   Final    BOTTLES DRAWN AEROBIC AND ANAEROBIC Blood Culture adequate volume Performed at [facility], [address] [place], [place] [address]    Culture  Setup Time   Final    GRAM NEGATIVE RODS IN BOTH AEROBIC AND ANAEROBIC BOTTLES CRITICAL RESULT CALLED TO, READ BACK BY AND VERIFIED WITH: Yves Dill February 05, 2018 1616 KLW Performed at [facility], [address] [place], [place]  [address]    Culture ESCHERICHIA COLI (A)  Final   Report Status 01/26/2018 FINAL  Final  MRSA PCR Screening     Status: None   Collection Time: 02-05-2018  5:31 AM  Result Value Ref Range Status   MRSA by PCR NEGATIVE NEGATIVE Final    Comment:        The GeneXpert MRSA Assay (FDA approved for NASAL specimens only), is one component of a comprehensive MRSA colonization surveillance program. It is not intended to diagnose MRSA infection nor to guide or monitor treatment for MRSA infections. Performed at [facility], [address] [place], [place] [address]   MRSA PCR Screening     Status: None   Collection Time: Feb 05, 2018  6:36 PM  Result Value Ref Range Status   MRSA by PCR NEGATIVE NEGATIVE Final    Comment:        The GeneXpert MRSA Assay (FDA approved for NASAL specimens only), is one component of a comprehensive MRSA colonization surveillance program. It is not intended to diagnose MRSA infection nor to guide or monitor treatment for MRSA infections. Performed at [facility], [address] [place], [place] [address]     Lab Basic Metabolic Panel: Recent Labs  Lab 01/25/18 0329 01/26/18 0429 01/28/2018 0500  NA 143 145 146*  K 4.3 3.8 3.4*  CL 107 112* 115*  CO2 22 27 23   GLUCOSE 91 94 357*  BUN <5* 11 20  CREATININE 1.83* 2.71* 3.06*  CALCIUM 8.7* 8.9 8.4*  MG 2.1  --   --   PHOS 2.2*  --   --    Liver Function Tests: Recent Labs  Lab 01/25/18 0329 01/26/18 0429 02/13/2018 0500  AST 109* 77* 74*  ALT 28 25 24   ALKPHOS 91 75 74  BILITOT 4.7* 4.7* 5.9*  PROT 7.7 7.3 6.9  ALBUMIN 2.8* 2.4* 2.1*   No results for input(s): LIPASE, AMYLASE in the last 168 hours. Recent Labs  Lab 01/26/18 0429 01/28/2018 0500  AMMONIA 57* 58*   CBC: Recent Labs  Lab 01/25/18 0329 01/26/18 0429 01/29/2018 0500  WBC 5.7 10.1 4.4  HGB 7.2* 8.3* 8.2*  HCT 19.7* 22.7* 22.7*  MCV 88.3 90.4 91.2  PLT 89* 82* 54*   Cardiac Enzymes: No results for  input(s): CKTOTAL, CKMB, CKMBINDEX, TROPONINI in the last 168 hours. Sepsis Labs: Recent Labs  Lab 01/25/18 0329 01/25/18 1715 01/26/18 0429 02/14/2018 0500  WBC 5.7  --  10.1 4.4  LATICACIDVEN  --  3.8*  --   --     Procedures/Operations  EVD placement   Marvel Plan 01/31/2018, 10:09 PM

## 2018-02-26 NOTE — Progress Notes (Signed)
ETT pulled back 2cm per MD order. Tube secured 19 at lip.  RT will continue to monitor.

## 2018-02-26 DEATH — deceased

## 2020-10-04 IMAGING — DX DG CHEST 1V PORT
1 series · 1 of 1 positions shown · non-contrast
Comparison: 01/25/2018 portable chest and earlier.

Addendum:
CLINICAL DATA: 32-year-old female with respiratory failure, acute
intracranial hemorrhage. Cirrhosis.

EXAM:
PORTABLE CHEST 1 VIEW

[chest]
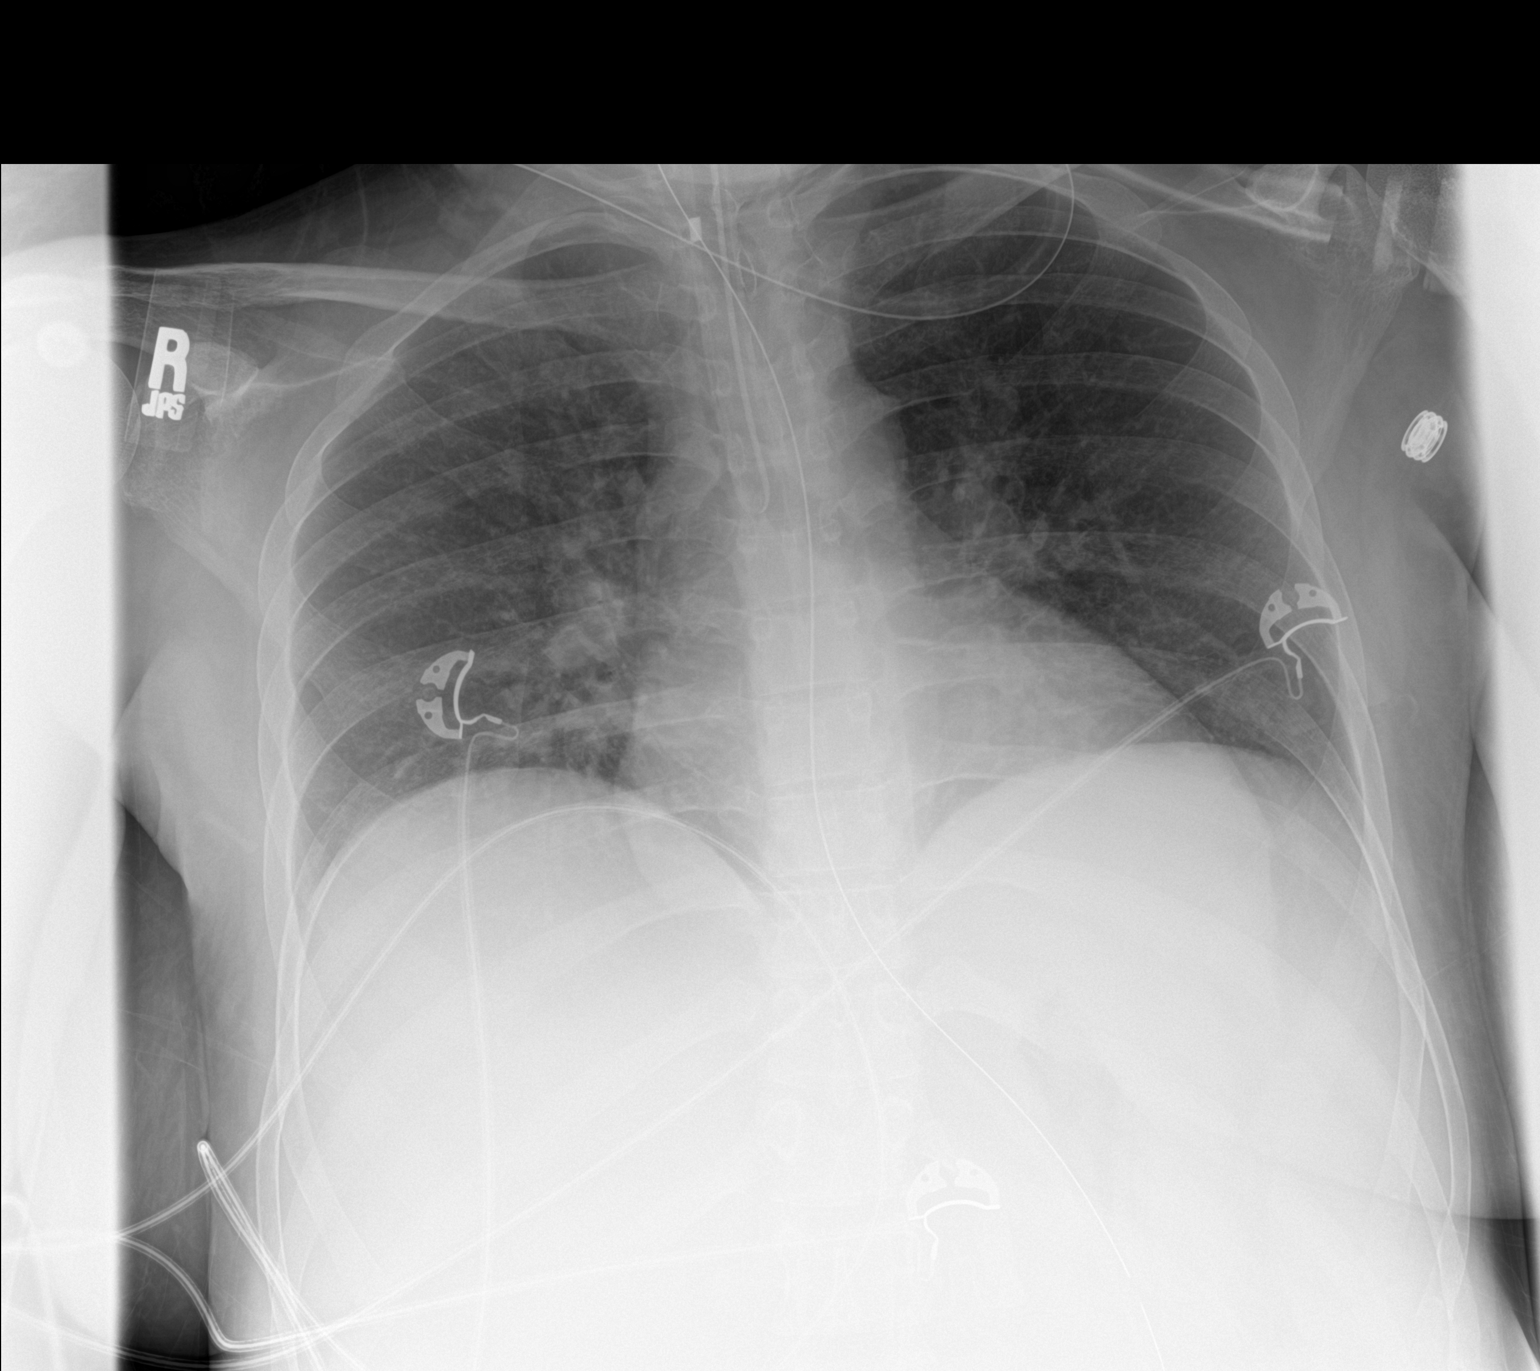

[1 of 1 positions shown; findings below may reference images not displayed]

FINDINGS: Portable AP semi upright view at 1545 hours. Endotracheal tube tip
abuts the carina. The tube could be retracted 2-3 centimeters for
optimal placement.

Enteric tube courses to the abdomen with side hole visible at the
gastric body level.

Continued low lung volumes but no focal pulmonary opacity,
pneumothorax, pulmonary edema or pleural effusion. Normal cardiac
size and mediastinal contours. Paucity of bowel gas in the upper
abdomen. No acute osseous abnormality identified.
IMPRESSION: 1. Endotracheal tube tip abuts the carina. Retract 2-3 cm for
optimal positioning.
2. Enteric tube side hole at the gastric body level.
3. Low lung volumes.  No acute cardiopulmonary abnormality.

ADDENDUM:
ETT position discussed with inpatient RN Reginah S Pije at 0100
hours.

*** End of Addendum ***
